# Patient Record
Sex: Female | Born: 1935 | Race: White | Hispanic: No | Marital: Married | Smoking: Never smoker
Health system: Southern US, Community
[De-identification: ages and names within clinical notes are randomized; demographics above are authoritative.]

## PROBLEM LIST (undated history)

## (undated) DIAGNOSIS — E785 Hyperlipidemia, unspecified: Secondary | ICD-10-CM

## (undated) DIAGNOSIS — I10 Essential (primary) hypertension: Secondary | ICD-10-CM

## (undated) DIAGNOSIS — G20A1 Parkinson's disease without dyskinesia, without mention of fluctuations: Secondary | ICD-10-CM

## (undated) DIAGNOSIS — G20 Parkinson's disease: Secondary | ICD-10-CM

## (undated) HISTORY — PX: COLON SURGERY: SHX602

## (undated) HISTORY — PX: EYE SURGERY: SHX253

---

## 2003-11-25 ENCOUNTER — Ambulatory Visit: Payer: Self-pay | Admitting: Family Medicine

## 2004-06-01 ENCOUNTER — Ambulatory Visit: Payer: Self-pay | Admitting: Family Medicine

## 2004-06-08 ENCOUNTER — Ambulatory Visit: Payer: Self-pay | Admitting: Family Medicine

## 2005-06-17 ENCOUNTER — Ambulatory Visit: Payer: Self-pay | Admitting: Family Medicine

## 2006-04-26 ENCOUNTER — Ambulatory Visit: Payer: Self-pay | Admitting: Family Medicine

## 2006-10-06 ENCOUNTER — Ambulatory Visit: Payer: Self-pay | Admitting: Family Medicine

## 2006-10-14 ENCOUNTER — Ambulatory Visit: Payer: Self-pay | Admitting: Unknown Physician Specialty

## 2006-10-20 ENCOUNTER — Ambulatory Visit: Payer: Self-pay | Admitting: Ophthalmology

## 2006-10-20 ENCOUNTER — Other Ambulatory Visit: Payer: Self-pay

## 2006-10-31 ENCOUNTER — Ambulatory Visit: Payer: Self-pay | Admitting: Ophthalmology

## 2006-12-07 ENCOUNTER — Ambulatory Visit: Payer: Self-pay | Admitting: Ophthalmology

## 2006-12-13 ENCOUNTER — Ambulatory Visit: Payer: Self-pay | Admitting: Ophthalmology

## 2008-03-28 ENCOUNTER — Ambulatory Visit: Payer: Self-pay | Admitting: Family Medicine

## 2009-09-23 ENCOUNTER — Ambulatory Visit: Payer: Self-pay

## 2009-10-30 ENCOUNTER — Ambulatory Visit: Payer: Self-pay | Admitting: Unknown Physician Specialty

## 2009-11-07 ENCOUNTER — Ambulatory Visit: Payer: Self-pay | Admitting: Unknown Physician Specialty

## 2011-05-21 ENCOUNTER — Ambulatory Visit: Payer: Self-pay | Admitting: Family Medicine

## 2011-06-27 ENCOUNTER — Emergency Department: Payer: Self-pay | Admitting: Emergency Medicine

## 2011-06-27 LAB — URINALYSIS, COMPLETE
Bilirubin,UR: NEGATIVE
Blood: NEGATIVE
Glucose,UR: NEGATIVE mg/dL (ref 0–75)
Leukocyte Esterase: NEGATIVE
Nitrite: NEGATIVE
Ph: 8 (ref 4.5–8.0)
Protein: NEGATIVE
RBC,UR: <1 /HPF (ref 0–5)
Specific Gravity: 1.006 (ref 1.003–1.030)
Squamous Epithelial: 3

## 2011-06-27 LAB — TROPONIN I: Troponin-I: <0.02 ng/mL

## 2011-06-27 LAB — CBC
HCT: 41 % (ref 35.0–47.0)
MCH: 29.6 pg (ref 26.0–34.0)
MCHC: 32.4 g/dL (ref 32.0–36.0)
MCV: 91 fL (ref 80–100)
RBC: 4.48 10*6/uL (ref 3.80–5.20)
RDW: 14.6 % — ABNORMAL HIGH (ref 11.5–14.5)

## 2011-06-27 LAB — COMPREHENSIVE METABOLIC PANEL
Alkaline Phosphatase: 98 U/L (ref 50–136)
Co2: 31 mmol/L (ref 21–32)
EGFR (African American): >60
EGFR (Non-African Amer.): >60
Glucose: 96 mg/dL (ref 65–99)
Osmolality: 279 (ref 275–301)
Potassium: 4.6 mmol/L (ref 3.5–5.1)
SGPT (ALT): 11 U/L — ABNORMAL LOW
Sodium: 139 mmol/L (ref 136–145)
Total Protein: 7.1 g/dL (ref 6.4–8.2)

## 2011-07-02 ENCOUNTER — Ambulatory Visit: Payer: Self-pay | Admitting: Internal Medicine

## 2011-08-09 ENCOUNTER — Encounter: Payer: Self-pay | Admitting: Internal Medicine

## 2011-08-17 LAB — URINALYSIS, COMPLETE
Bacteria: NONE SEEN
Glucose,UR: NEGATIVE mg/dL (ref 0–75)
Ketone: NEGATIVE
Nitrite: POSITIVE
Ph: 7 (ref 4.5–8.0)
Protein: NEGATIVE
RBC,UR: 1 /HPF (ref 0–5)
Specific Gravity: 1.004 (ref 1.003–1.030)
WBC UR: 55 /HPF (ref 0–5)

## 2011-08-19 LAB — URINE CULTURE

## 2011-09-02 ENCOUNTER — Encounter: Payer: Self-pay | Admitting: Internal Medicine

## 2012-03-29 ENCOUNTER — Other Ambulatory Visit: Payer: Self-pay | Admitting: Rheumatology

## 2012-03-29 LAB — BODY FLUID CELL COUNT WITH DIFFERENTIAL
Eosinophil: 0 %
Lymphocytes: 25 %
Neutrophils: 15 %
Nucleated Cell Count: 350 /mm3
Other Cells BF: 0 %

## 2012-03-29 LAB — SYNOVIAL FLUID, CRYSTAL: Crystals, Joint Fluid: NONE SEEN

## 2012-06-05 ENCOUNTER — Ambulatory Visit: Payer: Self-pay | Admitting: General Practice

## 2012-06-05 DIAGNOSIS — Z01810 Encounter for preprocedural cardiovascular examination: Secondary | ICD-10-CM

## 2012-06-05 LAB — URINALYSIS, COMPLETE
Bilirubin,UR: NEGATIVE
Blood: NEGATIVE
Glucose,UR: NEGATIVE mg/dL (ref 0–75)
Ketone: NEGATIVE
Ph: 6 (ref 4.5–8.0)
Protein: NEGATIVE
RBC,UR: 1 /HPF (ref 0–5)
Specific Gravity: 1.009 (ref 1.003–1.030)
WBC UR: 2 /HPF (ref 0–5)

## 2012-06-05 LAB — PROTIME-INR: INR: 0.9

## 2012-06-05 LAB — BASIC METABOLIC PANEL
Co2: 32 mmol/L (ref 21–32)
EGFR (Non-African Amer.): >60
Glucose: 85 mg/dL (ref 65–99)
Osmolality: 278 (ref 275–301)
Potassium: 4 mmol/L (ref 3.5–5.1)
Sodium: 139 mmol/L (ref 136–145)

## 2012-06-05 LAB — CBC
HCT: 39 % (ref 35.0–47.0)
MCH: 29.8 pg (ref 26.0–34.0)
MCHC: 33 g/dL (ref 32.0–36.0)
MCV: 91 fL (ref 80–100)

## 2012-06-05 LAB — SEDIMENTATION RATE: Erythrocyte Sed Rate: 11 mm/hr (ref 0–30)

## 2012-06-05 LAB — MRSA PCR SCREENING

## 2012-06-05 LAB — APTT: Activated PTT: 33.5 secs (ref 23.6–35.9)

## 2012-06-06 LAB — URINE CULTURE

## 2012-06-19 ENCOUNTER — Inpatient Hospital Stay: Payer: Self-pay | Admitting: General Practice

## 2012-06-20 LAB — BASIC METABOLIC PANEL
BUN: 9 mg/dL (ref 7–18)
Chloride: 102 mmol/L (ref 98–107)
Creatinine: 0.56 mg/dL — ABNORMAL LOW (ref 0.60–1.30)
EGFR (African American): >60
EGFR (Non-African Amer.): >60
Glucose: 94 mg/dL (ref 65–99)
Sodium: 135 mmol/L — ABNORMAL LOW (ref 136–145)

## 2012-06-20 LAB — PLATELET COUNT: Platelet: 151 10*3/uL (ref 150–440)

## 2012-06-21 LAB — BASIC METABOLIC PANEL
BUN: 9 mg/dL (ref 7–18)
Calcium, Total: 9.2 mg/dL (ref 8.5–10.1)
EGFR (African American): >60
Glucose: 87 mg/dL (ref 65–99)
Osmolality: 274 (ref 275–301)
Potassium: 3.7 mmol/L (ref 3.5–5.1)
Sodium: 138 mmol/L (ref 136–145)

## 2012-06-21 LAB — HEMOGLOBIN: HGB: 9.5 g/dL — ABNORMAL LOW (ref 12.0–16.0)

## 2012-06-22 ENCOUNTER — Encounter: Payer: Self-pay | Admitting: Internal Medicine

## 2012-06-25 LAB — URINALYSIS, COMPLETE
Bacteria: NONE SEEN
Bilirubin,UR: NEGATIVE
Glucose,UR: NEGATIVE mg/dL (ref 0–75)
Nitrite: NEGATIVE
Ph: 7 (ref 4.5–8.0)
Protein: NEGATIVE
RBC,UR: 2 /HPF (ref 0–5)
WBC UR: 16 /HPF (ref 0–5)

## 2012-07-02 ENCOUNTER — Encounter: Payer: Self-pay | Admitting: Internal Medicine

## 2013-12-13 ENCOUNTER — Emergency Department: Payer: Self-pay | Admitting: Emergency Medicine

## 2014-05-24 NOTE — Discharge Summary (Signed)
PATIENT NAME:  Rachel Hayes, COUDRIET MR#:  [id number] DATE OF BIRTH:  10-Sep-1931  DATE OF ADMISSION:  06/19/2012 DATE OF DISCHARGE:  06/22/2012  ADMITTING DIAGNOSIS: Degenerative arthrosis of the right knee.   DISCHARGE DIAGNOSIS: Degenerative arthrosis of the right knee.   HISTORY: The patient is a 81 year old female who has been followed at [facility] for progression of right knee pain. She has a chronic history of right knee pain. Recently she had been having some increased discomfort and was seen initially by Dr. Lavenia Atlas who did aspiration as well as a cortisone injection. She states that this did help a lot, but she is still having pain that she rated at 5 to 6. The pain generally was with weight-bearing activities. She did not have a lot of pain when she was sitting or sleeping. She states that she was currently receiving physical therapy for strengthening prior to surgery. She has also had an unloading arthritic brace that was custom-made for her which she states did not help and actually caused some increased discomfort. She has Parkinson's and subsequently is having difficulty getting around. She had gone to using a rolling walker at a very slow pace. She states that the pain had increased to the point that she was having significant limitation with her activities of daily living. X-rays taken in [facility] showed narrowing of the lateral cartilage space with bone-on-bone articulation noted as well as being associated with valgus alignment. Osteophyte as well as subchondral sclerosis was noted. After discussion of the risks and benefits of surgical intervention, the patient expressed her understanding of the risks and benefits and agreed for plans for surgical intervention.   PROCEDURE: Right total knee arthroplasty using computer-assisted navigation.   ANESTHESIA: Femoral nerve block with spinal.   SOFT TISSUE RELEASE: Anterior cruciate ligament, posterior cruciate ligament, deep  medial collateral ligament, as well as patellofemoral ligament.   IMPLANTS UTILIZED: DePuy PFC Sigma size 2.5 posterior stabilized femoral component (cemented), size 2.5 MBT tibial component (cemented), 32 mm 3 pegged oval dome patella (cemented), and a 10 mm stabilized rotating platform polyethylene insert.   HOSPITAL COURSE: The patient tolerated the procedure very well. She had no complications. She was taken to PAC-U where she was stabilized and then transferred to the orthopedic floor. She began receiving anticoagulation therapy of Lovenox 30 mg sub-Q q. 12 hours per anesthesia and pharmacy protocol. She was fitted with TED stockings bilaterally. These were allowed to be removed 1 hour per 8 hour shift. The right one was applied on day 2 following removal of the Hemovac and dressing change. The patient was also fitted with AVI compression foot pumps bilaterally set at 80 mmHg.  Her calves have been nontender. There has been no evidence of any DVTs of the lower extremity. Heels were elevated off the bed using rolled towels.   The patient has denied any chest pain or shortness of breath. Vital signs have been stable. She has been afebrile. Hemodynamically she was stable and no transfusions were given. The patient's pain rating was all over the place. Upon entering the room in the morning, she states her pain was a level 2 and then 10 to 15 minutes later the nurses would go back in and she would state it was a 12.  On a scale of 0 to 10, she would say it was 15 and the next moment she would say it was down to 2 or 3.  Not sure what to make of this.  Not sure if this is related to Parkinson's. She did not appear to be in a lot of discomfort during timeframe. She appeared to be sleeping a lot and did not appear to be in discomfort.   Physical therapy was initiated on day 1 for gait training and transfers. This has been somewhat slow secondary to her age and Parkinson's. She has had no complications, however.  Occupational therapy was also initiated on day 1 for ADL and assistive devices.   The patient's IV, Foley, and Hemovac were DC'd on day 2 along with a dressing change. The wound was free of any drainage or signs of infection. Polar Care was reapplied to the surgical leg maintaining a temperature of 40 to 50 degrees Fahrenheit.   DRUG ALLERGIES: BETIMOL, LUMIGAN, STATIN DRUGS.   DISPOSITION: The patient is discharged to rehab facility in improved stable condition.   DISCHARGE INSTRUCTIONS:  She may weight bear as tolerated. Elevate the heels off the bed. Continue TED stockings. These are to be worn around-the-clock. They may be removed 1 hour per 8 hour shift. Incentive spirometer q. 1 hour while awake. Encourage cough and deep breathing q. 2 hours while awake. She is placed on a regular diet. Continue Polar Care maintaining a temperature of 40 to 50 degrees Fahrenheit.  Change dressing as needed.  She has [facility]. She will continue PT for gait training and transfers, OT for ADL and assistive devices. She is  not to take a shower until the staples are removed. If the patient stays longer than 2 weeks, physical therapy is to send Dr. Ernest PineHooten a biweekly report on her progress with no exceptions.   DISCHARGE MEDICATIONS: 1.  Calcium carbonate 500 mg. 2.  Vitamin D 200 units 1 tablet b.i.d. with meal. 3.  Sinemet 25/100 mg 2 tablets at bedtime. 4.  Sinemet 25/100 mg 1 tablet usual schedule every day at 8:00 a.m., 10:00 a.m., 1200, 1400, 1600, and 1800.  5.  Celebrex 200 mg b.i.d.  6.  Senokot-S 1 tablet b.i.d.  7.  Omega-3 fatty acid 1 gram b.i.d.  8.  Prilosec 20 mg q. a.m.  9.  Ditropan 5 mg b.i.d.  10.  Eldepryl 5 mg b.i.d. with meal. 11.   Lovenox 30 mg sub-Q q. 12 hours for 14 days then discontinue and begin taking one 81 mg enteric-coated aspirin.  1.2  Tylenol ES 500 to 1000 mg q. 4 to 6 hours p.r.n. for temperatures of 100.4. 12.   Mylanta 30 mL q. 6 hours p.r.n.  13.  Dulcolax suppository 10 mg rectally daily p.r.n. for constipation. 14.  Milk of Magnesia 30 mL b.i.d. p.r.n.  15.  Oxycodone 5 to 10 mg q. 4 to 6 hours p.r.n. for pain.  16.  Tramadol 50 to 100 mg q. 4 hours p.r.n. for pain.  17.  Enema soapsuds if no results with milk of magnesia or Dulcolax.   PAST MEDICAL HISTORY: 1.  Cystic breast disease. 2.  Arthritis. 3.  Bilateral cataracts.  4.  Parkinson's disease.  PAST SURGICAL HISTORY:  1.  Colostomy in 2013. 2.  Bilateral cataract extraction.  ____________________________ Van ClinesJon Tyller Bowlby, PA [email or web address] D: 06/22/2012 07:46:47 ET T: 06/22/2012 08:01:14 ET JOB#: [id number]  cc: Van ClinesJon Ramey Schiff, PA, <Dictator> Jonthan Leite PA ELECTRONICALLY SIGNED 07/08/2012 9:16

## 2014-05-24 NOTE — Op Note (Signed)
PATIENT NAME:  Roslynn AmbleMCBANE, Tashe L MR#:  [id number] DATE OF BIRTH:  31-Aug-1931  DATE OF PROCEDURE:  06/19/2012  PREOPERATIVE DIAGNOSIS: Degenerative arthrosis of the right knee.   POSTOPERATIVE DIAGNOSIS: Degenerative arthrosis of the right knee.   PROCEDURE PERFORMED: Right total knee arthroplasty using computer-assisted navigation.   SURGEON: Illene LabradorJames P. Angie FavaHooten, Jr., MD   ASSISTANT: Van ClinesJon Wolfe, PA  ANESTHESIA: Femoral nerve block and spinal.   ESTIMATED BLOOD LOSS: 50 mL.   FLUIDS REPLACED: 900 mL of crystalloid.   TOURNIQUET TIME: 80 minutes.   DRAINS: Two medium drains through reinfusion system.   SOFT TISSUE RELEASES: Anterior cruciate ligament, posterior cruciate ligament, deep medial collateral ligament and patellofemoral ligament.   IMPLANTS UTILIZED: DePuy PFC Sigma size 2.5 posterior stabilized femoral component (cemented), size 2.5 MBT tibial component (cemented), 32 mm 3-peg oval dome patella (cemented), and a 10 mm stabilized rotating platform polyethylene insert.   INDICATIONS FOR SURGERY: The patient is a 81 year old female who has been seen for complaints of progressive right knee pain. X-rays demonstrated severe degenerative changes with valgus deformity. After discussion of the risks and benefits of surgical intervention, the patient expressed understanding of the risks and benefits and agreed with plans for surgical intervention.   PROCEDURE IN DETAIL: The patient was brought into the operating room, and after adequate femoral nerve block and spinal anesthesia was achieved, a tourniquet was placed on the patient's upper right thigh. The patient's right knee and leg were cleaned and prepped with alcohol and DuraPrep and draped in the usual sterile fashion. A "timeout" was performed as per usual protocol. The right lower extremity was exsanguinated using an Esmarch, and the tourniquet was inflated to 300 mmHg.  Anterior and longitudinal incisions were made, followed by a  standard mid vastus approach. A large effusion was evacuated. The deep fibers of the medial collateral ligament were elevated in a subperiosteal fashion off the medial flare of the tibia so as to maintain a continuous soft tissue sleeve. The patella was subluxed laterally, and the patellofemoral ligament was incised. Inspection of the knee demonstrated severe degenerative changes in tricompartmental fashion with evidence of eburnated bone and bony erosion most notably to the lateral compartment. Prominent osteophytes were debrided using a rongeur. The anterior and posterior cruciate ligaments were excised. Two 4.0 mm Schanz pins were inserted into the femur and into the tibia for attachment of the array of trackers used for computer-assisted navigation. Hip center was identified using a circumduction technique. Distal landmarks were mapped using the computer. The distal femur and proximal tibia were mapped using the computer. A distal femoral cutting guide was positioned using computer-assisted navigation so as to achieve a 5-degree distal valgus cut. The cut was performed and verified using the computer. The distal femur was sized, and it was felt that a size 2.5 femoral component was appropriate. A size 2.5 cutting guide was positioned, and the anterior cut was performed and verified using the computer. This was followed by completion of the posterior and chamfer cuts. Femoral cutting guide for a central box was then positioned, and the central box cut was performed.   Attention was then directed to the proximal tibia. Medial and lateral menisci were excised. The extramedullary tibial cutting guide was positioned using computer-assisted navigation so as to achieve 0-degree varus-valgus alignment and 0-degrees posterior slope. Cut was performed and verified using the computer. The proximal tibia was sized, and it was felt that a size 2.5 tibial tray was appropriate. Tibial and femoral trials  were inserted  followed by insertion of a 10 mm polyethylene insert. Excellent medial and lateral soft tissue balancing was appreciated both in full extension and in flexion. Finally, the patella was cut and prepared so as to accommodate a 32 mm 3-peg oval dome patella, and the patellar component was placed and the knee was placed through a range of motion with excellent patellar tracking appreciated.   The femoral trial was removed. A central post hole for the tibial component was reamed, followed by insertion of a keel punch. Tibial trials were then removed. The cut surfaces of bone were irrigated with copious amounts of normal saline with antibiotic solution using pulsatile lavage and then suctioned dry. Polymethyl methacrylate cement was prepared in the usual fashion using a vacuum mixer. Cement was applied to the cut surface of the proximal tibia as well as along the undersurface of a size 2.5 MBT tibial component. The tibial component was positioned and impacted into place. Excess cement was removed using freer elevators. Cement was then applied to the cut surface of the femur as well as along the posterior flanges of a size 2.5 femoral component. The femoral component was positioned and impacted into place. Excess cement was removed using freer elevators. A 10 mm polyethylene trial was inserted, and the knee was brought into full extension with steady axial compression applied. Finally, cement was applied to the backside of a 32 mm 3-peg oval dome patella, and the patellar component was positioned and patellar clamp applied. Excess cement was removed using freer elevators.  After adequate curing of cement, the tourniquet was deflated after a total tourniquet time of 80 minutes. Hemostasis was achieved using electrocautery. The knee was irrigated with copious amounts of normal saline with antibiotic solution using pulsatile lavage and then suctioned dry. The knee was inspected for any residual cement debris; 30 mL of  0.25% Marcaine with epinephrine was injected along the posterior capsule. A 10 mm stabilized rotating platform polyethylene insert was inserted, and the knee was placed through a range of motion. Excellent patellar tracking was appreciated, and excellent medial and lateral soft tissue balancing was noted. Two medium drains were placed in the wound bed and brought out through a separate stab incision to be attached to a reinfusion system. The medial parapatellar portion of the incision was reapproximated using interrupted sutures of #1 Vicryl. The subcutaneous tissue was approximated in layers using first #0 Vicryl followed by #2-0 Vicryl. The skin was closed with skin staples. Sterile dressing was applied.   The patient tolerated the procedure well. She was transported to the recovery room in stable condition.   ____________________________ Illene Labrador. Angie Fava., MD jph:cb D: 06/19/2012 14:28:35 ET T: 06/19/2012 20:37:12 ET JOB#: [id number]  cc: Illene Labrador. Angie Fava., MD, <Dictator> Illene Labrador Angie Fava MD ELECTRONICALLY SIGNED 07/05/2012 19:42

## 2016-05-23 ENCOUNTER — Emergency Department: Payer: Medicare Other

## 2016-05-23 ENCOUNTER — Emergency Department
Admission: EM | Admit: 2016-05-23 | Discharge: 2016-05-23 | Disposition: A | Discharge status: Home / Self Care | Payer: Medicare Other | Attending: Emergency Medicine | Admitting: Emergency Medicine

## 2016-05-23 ENCOUNTER — Encounter: Payer: Self-pay | Admitting: Emergency Medicine

## 2016-05-23 DIAGNOSIS — R071 Chest pain on breathing: Secondary | ICD-10-CM | POA: Diagnosis present

## 2016-05-23 DIAGNOSIS — G20 Parkinson's disease: Secondary | ICD-10-CM | POA: Diagnosis not present

## 2016-05-23 DIAGNOSIS — I10 Essential (primary) hypertension: Secondary | ICD-10-CM | POA: Diagnosis not present

## 2016-05-23 HISTORY — DX: Parkinson's disease: G20

## 2016-05-23 HISTORY — DX: Hyperlipidemia, unspecified: E78.5

## 2016-05-23 HISTORY — DX: Essential (primary) hypertension: I10

## 2016-05-23 HISTORY — DX: Parkinson's disease without dyskinesia, without mention of fluctuations: G20.A1

## 2016-05-23 LAB — CBC
HCT: 42.3 % (ref 35.0–47.0)
Hemoglobin: 13.9 g/dL (ref 12.0–16.0)
MCH: 30 pg (ref 26.0–34.0)
MCHC: 32.9 g/dL (ref 32.0–36.0)
MCV: 91 fL (ref 80.0–100.0)
PLATELETS: 256 10*3/uL (ref 150–440)
RBC: 4.64 MIL/uL (ref 3.80–5.20)
RDW: 14.8 % — AB (ref 11.5–14.5)
WBC: 10.8 10*3/uL (ref 3.6–11.0)

## 2016-05-23 LAB — BASIC METABOLIC PANEL
Anion gap: 7 (ref 5–15)
BUN: 20 mg/dL (ref 6–20)
CALCIUM: 9.9 mg/dL (ref 8.9–10.3)
CO2: 28 mmol/L (ref 22–32)
Chloride: 101 mmol/L (ref 101–111)
Creatinine, Ser: 0.76 mg/dL (ref 0.44–1.00)
GFR calc Af Amer: >60 mL/min (ref >60)
Glucose, Bld: 94 mg/dL (ref 65–99)
POTASSIUM: 4.4 mmol/L (ref 3.5–5.1)
SODIUM: 136 mmol/L (ref 135–145)

## 2016-05-23 LAB — HEPATIC FUNCTION PANEL
ALBUMIN: 3.8 g/dL (ref 3.5–5.0)
ALK PHOS: 73 U/L (ref 38–126)
ALT: <5 U/L — ABNORMAL LOW (ref 14–54)
AST: 18 U/L (ref 15–41)
Bilirubin, Direct: <0.1 mg/dL — ABNORMAL LOW (ref 0.1–0.5)
TOTAL PROTEIN: 6.6 g/dL (ref 6.5–8.1)
Total Bilirubin: 0.7 mg/dL (ref 0.3–1.2)

## 2016-05-23 LAB — TROPONIN I

## 2016-05-23 MED ORDER — IOPAMIDOL (ISOVUE-370) INJECTION 76%
75.0000 mL | Freq: Once | INTRAVENOUS | Status: AC | PRN
  Administered 2016-05-23: 75 mL via INTRAVENOUS

## 2016-05-23 NOTE — ED Triage Notes (Signed)
Pt brought in by [facility] from home for chest pain that started this morning, pain is worse with deep breath in. Pt rates pain 6/10, pt denies radiation of pain or any other symptoms. Pt does not appear to be in any distress at this time.

## 2016-05-23 NOTE — ED Provider Notes (Signed)
Rachel Hayes   ____________________________________________   First MD Initiated Contact with Patient 05/23/16 0915     (approximate)  I have reviewed the triage vital signs and the nursing notes.   HISTORY  Chief Complaint Chest Pain    HPI Rachel Hayes is a 83 y.o. female who reports onset of sharp pleuritic chest pain in her chest this morning possibly 4:00. Pain is sharp and stabbing patient is very vague about the way it feels but it seems to be in the epigastric area radiating up to the throat. Patient denies any fever or vomiting coughing or any other complaints. Patient does not want pain medicine at present.Pain seems to be moderate in nature.  Past Medical History:  Diagnosis Date  . Hyperlipemia   . Hypertension   . Parkinson disease (HCC)     There are no active problems to display for this patient.   Past Surgical History:  Procedure Laterality Date  . COLON SURGERY    . EYE SURGERY      Prior to Admission medications   Not on File    Allergies Patient has no known allergies.  No family history on file.  Social History Social History  Substance Use Topics  . Smoking status: Never Smoker  . Smokeless tobacco: Never Used  . Alcohol use No    Review of Systems Constitutional: No fever/chills Eyes: No visual changes. ENT: No sore throat. Cardiovascular: See history of present illness Respiratory: Denies shortness of breath. Gastrointestinal: No abdominal pain.  No nausea, no vomiting.  No diarrhea.  No constipation. Genitourinary: Negative for dysuria. Musculoskeletal: Negative for back pain. Skin: Negative for rash. Neurological: Negative for headaches, focal weakness or numbness.  10-point ROS otherwise negative.  ____________________________________________   PHYSICAL EXAM:  VITAL SIGNS: ED Triage Vitals  Enc Vitals Group     BP 05/23/16 0926 (!) 159/93     Pulse  Rate 05/23/16 0926 66     Resp 05/23/16 0926 15     Temp 05/23/16 0926 98.2 F (36.8 C)     Temp Source 05/23/16 0926 Oral     SpO2 05/23/16 0926 98 %     Weight 05/23/16 0916 120 lb (54.4 kg)     Height --      Head Circumference --      Peak Flow --      Pain Score 05/23/16 0916 6     Pain Loc --      Pain Edu? --      Excl. in GC? --     Constitutional: Alert and oriented. Well appearing and in no acute distress. Eyes: Conjunctivae are normal. PERRL. EOMI. Head: Atraumatic. Nose: No congestion/rhinnorhea. Mouth/Throat: Mucous membranes are moist.  Oropharynx non-erythematous. Neck: No stridor.   Cardiovascular: Normal rate, regular rhythm. Grossly normal heart sounds.  Good peripheral circulation. Respiratory: Normal respiratory effort.  No retractions. Lungs CTAB. Gastrointestinal: Soft and nontender. No distention. No abdominal bruits. No CVA tenderness. Musculoskeletal: No lower extremity tenderness nor edema.  No joint effusions. Skin:  Skin is warm, dry and intact. No rash noted. No spinal tenderness is present.  ____________________________________________   LABS (all labs ordered are listed, but only abnormal results are displayed)  Labs Reviewed  CBC - Abnormal; Notable for the following:       Result Value   RDW 14.8 (*)    All other components within normal limits  HEPATIC FUNCTION PANEL - Abnormal; Notable for  the following:    ALT <5 (*)    Bilirubin, Direct <0.1 (*)    All other components within normal limits  BASIC METABOLIC PANEL  TROPONIN I   ____________________________________________  EKG  EKG read and interpreted by me shows normal sinus rhythm rate of 69 right axis deviation no other abnormalities. ____________________________________________  RADIOLOGY  Study Result   CLINICAL DATA:  Patient with chest pain. History of Parkinson's disease. Pain with inspiration.  EXAM: CHEST  2 VIEW  COMPARISON:   None.  FINDINGS: Cardiomegaly. Tortuosity and calcification of the thoracic aorta. No consolidative pulmonary opacities. No pleural effusion or pneumothorax. Age-indeterminate wedge compression deformity of a mid and lower thoracic spine vertebral body. Degenerative changes of the thoracic spine.  IMPRESSION: Age-indeterminate wedge compression deformities of a mid and lower thoracic spine vertebral body, recommend correlation for point tenderness.  Cardiomegaly.  Aortic atherosclerosis.   Electronically Signed   By: Annia Belt M.D.   On: 05/23/2016 09:56     Study Result   CLINICAL DATA:  Pt brought in by [facility] from home for chest pain that started this morning, pain is worse with deep breath in. Pt rates pain 6/10, pt denies radiation of pain or any other symptoms  EXAM: CT ANGIOGRAPHY CHEST WITH CONTRAST  TECHNIQUE: Multidetector CT imaging of the chest was performed using the standard protocol during bolus administration of intravenous contrast. Multiplanar CT image reconstructions and MIPs were obtained to evaluate the vascular anatomy.  CONTRAST:  75 mL of Isovue 370 intravenous contrast  COMPARISON:  Current chest radiograph  FINDINGS: Cardiovascular: Contrast opacification of the smaller segmental and subsegmental pulmonary arteries is suboptimal limiting their assessment. The larger vessels are well opacified. There is no evidence of a central pulmonary embolus.  The heart is mildly enlarged. There are moderate coronary artery calcifications. There is mild atherosclerosis along the thoracic aorta and at the origin of the branch vessels. No significant stenosis. No aortic dissection.  Mediastinum/Nodes: Moderate hiatal hernia. No mediastinal or hilar masses. No enlarged lymph nodes. No neck base or axillary masses or adenopathy. Trachea is widely patent.  Lungs/Pleura: No evidence of pneumonia or pulmonary edema. There scattered areas  of mild lung scarring. Small dense nodules are noted in the right upper lobe consistent healed granuloma. There is minor dependent subsegmental atelectasis. No pleural effusion. No pneumothorax.  Upper Abdomen: No acute abnormality.  Musculoskeletal: Mild compression fracture of T8. Moderate chronic appearing compression fracture of T11. No other fractures. No osteoblastic or osteolytic lesions. Arthropathic changes are noted of the right shoulder.  Review of the MIP images confirms the above findings.  IMPRESSION: 1. Exam is somewhat limited for the assessment of smaller segmental or subsegmental pulmonary emboli. Allowing for this limitation, there is no evidence of a pulmonary embolus. 2. Moderate size hiatal hernia. 3. Mild cardiomegaly with moderate coronary artery calcifications. 4. Mild compression fracture of T8 of unclear chronicity. Chronic appearing moderate compression fracture of T11 5. No acute findings in the lungs.   Electronically Signed   By: Amie Portland M.D.   On: 05/23/2016 11:48      ____________________________________________   PROCEDURES  Procedure(s) performed:   Procedures  Critical Care performed:  ____________________________________________   INITIAL IMPRESSION / ASSESSMENT AND PLAN / ED COURSE  Pertinent labs & imaging results that were available during my care of the patient were reviewed by me and considered in my medical decision making (see chart for details).       ____________________________________________  FINAL CLINICAL IMPRESSION(S) / ED DIAGNOSES  Final diagnoses:  Chest pain on breathing      NEW MEDICATIONS STARTED DURING THIS VISIT:  New Prescriptions   No medications on file     Hayes:  This document was prepared using Dragon voice recognition software and may include unintentional dictation errors.    Arnaldo Natal, MD 05/23/16 [phone]

## 2016-05-23 NOTE — Discharge Instructions (Signed)
Please use Tylenol for pain. Please follow-up with your regular doctor. Please return if you have forced pain fever shortness of breath or feels sicker.

## 2016-05-23 NOTE — ED Notes (Signed)
Pt given  of aspirin by EMS

## 2016-05-23 NOTE — ED Notes (Signed)

## 2016-10-07 ENCOUNTER — Other Ambulatory Visit: Payer: Self-pay | Admitting: Neurosurgery

## 2016-10-07 DIAGNOSIS — R1312 Dysphagia, oropharyngeal phase: Secondary | ICD-10-CM

## 2016-11-02 ENCOUNTER — Ambulatory Visit
Admission: RE | Admit: 2016-11-02 | Discharge: 2016-11-02 | Disposition: A | Discharge status: Home / Self Care | Payer: Medicare Other | Source: Ambulatory Visit | Attending: Neurosurgery | Admitting: Neurosurgery

## 2016-11-02 DIAGNOSIS — R1312 Dysphagia, oropharyngeal phase: Secondary | ICD-10-CM | POA: Diagnosis not present

## 2016-11-02 NOTE — Therapy (Addendum)
[facility] DIAGNOSTIC RADIOLOGY [address] [place], [place], [address] Phone: [phone]   Fax:     Modified Barium Swallow  Patient Details  Name: Rachel Hayes MRN: [id number] Date of Birth: 03/26/1931 No Data Recorded  Encounter Date: 11/02/2016      End of Session - 11/02/16 1500    Visit Number 1   Number of Visits 1   Date for SLP Re-Evaluation 11/02/16   SLP Start Time 1300   SLP Stop Time  1400   SLP Time Calculation (min) 60 min   Activity Tolerance Patient tolerated treatment well      Past Medical History:  Diagnosis Date  . Hyperlipemia   . Hypertension   . Parkinson disease [facility])     Past Surgical History:  Procedure Laterality Date  . COLON SURGERY    . EYE SURGERY      There were no vitals filed for this visit.     Subjective: Patient behavior: (alertness, ability to follow instructions, etc.): pt awake, verbally responsive. Noted low volume of speech; reduced breath support for speech and min dysarthria during speech. Pt does have a dx of Parkinson's Disease and takes Sinemet 3x daily WITH her meals. Pt has native dentition. Chief complaint: dysphagia. Pt and family/caregiver present indicated that pt has had some increased coughing during meals recently. All denied any decline in her Pulmonary status - no dx of pneumonia or chest colds. Family does cut her foods for her to make the "softer". Pt is being seen by [facility] SLP from [facility] for therapy.   Objective:  Radiological Procedure: A videoflouroscopic evaluation of oral-preparatory, reflex initiation, and pharyngeal phases of the swallow was performed; as well as a screening of the upper esophageal phase.  I. POSTURE: upright II. VIEW: lateral III. COMPENSATORY STRATEGIES: none IV. BOLUSES ADMINISTERED:   Thin Liquid: 5 trials  Nectar-thick Liquid: 1 trial  Honey-thick Liquid: NT  Puree: 3 trials  Mechanical Soft: 1 trial V. RESULTS OF  EVALUATION: A. ORAL PREPARATORY PHASE: (The lips, tongue, and velum are observed for strength and coordination)       **Overall Severity Rating: MILD-MODERATE. Decreased bolus control w/ premature spillage moreso w/ thin liquids; tremorous lingual activity which can impact lingual control of the bolus for timely A-P transfer and conhesion; more of a munching pattern vs rotary w/ increased textured foods; slight-min oral residue cleared adequately b/t trials w/ independent lingual sweep attention and f/u swallow.  B. SWALLOW INITIATION/REFLEX: (The reflex is normal if "triggered" by the time the bolus reached the base of the tongue)  **Overall Severity Rating: MODERATE. Delayed pharyngeal swallow initiation w/ thin liquids(via Cup) spilling to the Pyriform Sinuses and nectar consistency liquids and purees spilling to the Valleculae before pharyngeal swallow initiation engaged resulting in decreased airway closure/timing. Aspiration occurred before the swallow w/ thin liquids x1 - delayed cough response noted. Pt was also distracted during the bolus intake (d/t spillage of drink on her chin) which appeared to negatively impact pt's attention and response. Pt was able to give more focused attention during the next trials w/ no Aspiration occurring during those. Of note, drinking using a Straw increased spillage of bolus material directly to the Pyriform Sinuses w/out pt's awareness/control of the premature spillage.  C. PHARYNGEAL PHASE: (Pharyngeal function is normal if the bolus shows rapid, smooth, and continuous transit through the pharynx and there is no pharyngeal residue after the swallow)  **Overall Severity Rating: [facility]. Pt exhibited no  significant pharyngeal residue post swallowing indicating adequate pharyngeal pressure and laryngeal excursion during swallowing.  D. LARYNGEAL PENETRATION: (Material entering into the laryngeal inlet/vestibule but not aspirated): x1 - Silent E. ASPIRATION: x1(and x1  using a Straw) - initially Silent w/ delayed cough response - also noted pt's volitional cough was reduced in effort and coordination  F. ESOPHAGEAL PHASE: (Screening of the upper esophagus): appeared wfl.     ASSESSMENT: Pt appears to present w/ Mild-Moderate oropharyngeal phase dysphagia w/ increased risk for aspiration moreso w/ thin liquids. During the oral phase, pt presented w/ decreased bolus control w/ premature spillage moreso w/ thin liquids; tremorous lingual activity which can impact lingual control of the bolus for timely A-P transfer and conhesion; more of a munching pattern vs rotary w/ increased textured foods. Pt had slight-min oral residue post swallowing which she cleared adequately b/t trials w/ independent lingual sweep attention and f/u swallow. During the pharyngeal phase, pt exhibited delayed pharyngeal swallow initiation w/ thin liquids(via Cup) spilling to the Pyriform Sinuses and nectar consistency liquids and purees spilling to the Valleculae before pharyngeal swallow initiation engaged resulting in decreased airway closure/timing. Aspiration occurred before the swallow w/ thin liquids x1 - delayed cough response noted. Pt was also distracted during the bolus intake (d/t spillage of drink on her chin) which appeared to negatively impact pt's attention and control during the task. Pt was able to give more focused attention during the next trials using more focus and aspiration precautions w/ no Aspiration occurring during those. Of note, drinking using a Straw increased spillage of bolus material directly to the Pyriform Sinuses w/out pt's awareness/control of the premature spillage and silent Aspiration occurred. Pt exhibited no significant pharyngeal residue post swallowing indicating adequate pharyngeal pressure and laryngeal excursion during swallowing. Recommend pt follow more of a Dysphagia 3(well-cut foods, moistened for less exertion and energy conservation) diet w/ thin  liquids via CUP only - NO Straws. Recommend aspiration precautions w/ any oral intake; reducing distractions during meals. Recommend monitoring for any Pulmonary decline or increased chest congestion/pneumonia - consider use of nectar consistency liquids at that time w/ pt/family if appropriate per pt's wishes. Information given to pt/family on the Dysphagia Drink Cup for pt.   PLAN/RECOMMENDATIONS:  A. Diet: Dysphagia level 3(mech soft) w/ well-cut meats w/ moistened foods; Thin liquids by CUP - NO STRAW.  Pills in Puree for easier swallowing.   B. Swallowing Precautions: Aspiration precautions; NO Straws. Use of Dysphagia Drink Cup for safer intake/swallowing(monitors bolus size).   C. Recommended consultation to f/u w/ [facility] SLP for further education on aspiration precautions; oral care; use of strategies; use of puree w/ Pill swallowing   D. Therapy recommendations: education   E. Results and recommendations were discussed w/ pt and family/caregiver present; video viewed and discussed thoroughly          Patient will benefit from skilled therapeutic intervention in order to improve the following deficits and impairments:   Dysphagia, oropharyngeal - Plan: DG OP Swallowing Func-Medicare/Speech Path, DG OP Swallowing Func-Medicare/Speech Path      G-Codes - 11/25/16 1501    Functional Assessment Tool Used clinical judgement   Functional Limitations Swallowing   Swallow Current Status (Z1245) At least 20 percent but less than 40 percent impaired, limited or restricted   Swallow Goal Status (Y0998) At least 20 percent but less than 40 percent impaired, limited or restricted   Swallow Discharge Status [phone]) At least 20 percent but less than 40 percent impaired, limited  or restricted          Problem List There are no active problems to display for this patient.    Jerilynn Som, MS, CCC-SLP Mykelti Goldenstein 11/02/2016, 3:02 PM  [facility] DIAGNOSTIC RADIOLOGY [address] [place], [place], [address] Phone: [phone]   Fax:     Name: Rachel Hayes MRN: [id number] Date of Birth: 24-May-1931

## 2018-01-03 ENCOUNTER — Other Ambulatory Visit: Payer: Self-pay

## 2018-01-03 ENCOUNTER — Emergency Department
Admission: EM | Admit: 2018-01-03 | Discharge: 2018-01-03 | Disposition: A | Discharge status: Home / Self Care | Payer: Medicare Other | Attending: Emergency Medicine | Admitting: Emergency Medicine

## 2018-01-03 ENCOUNTER — Emergency Department: Payer: Medicare Other

## 2018-01-03 DIAGNOSIS — G20 Parkinson's disease: Secondary | ICD-10-CM | POA: Diagnosis not present

## 2018-01-03 DIAGNOSIS — I10 Essential (primary) hypertension: Secondary | ICD-10-CM | POA: Insufficient documentation

## 2018-01-03 DIAGNOSIS — Z043 Encounter for examination and observation following other accident: Secondary | ICD-10-CM | POA: Diagnosis not present

## 2018-01-03 DIAGNOSIS — R531 Weakness: Secondary | ICD-10-CM | POA: Diagnosis present

## 2018-01-03 LAB — COMPREHENSIVE METABOLIC PANEL
ALBUMIN: 4.2 g/dL (ref 3.5–5.0)
ALK PHOS: 67 U/L (ref 38–126)
ALT: <5 U/L (ref 0–44)
ANION GAP: 10 (ref 5–15)
AST: 15 U/L (ref 15–41)
BUN: 16 mg/dL (ref 8–23)
CALCIUM: 10.1 mg/dL (ref 8.9–10.3)
CHLORIDE: 95 mmol/L — AB (ref 98–111)
CO2: 30 mmol/L (ref 22–32)
Creatinine, Ser: 0.57 mg/dL (ref 0.44–1.00)
GFR calc Af Amer: >60 mL/min (ref >60)
GFR calc non Af Amer: >60 mL/min (ref >60)
GLUCOSE: 93 mg/dL (ref 70–99)
Potassium: 4.3 mmol/L (ref 3.5–5.1)
Sodium: 135 mmol/L (ref 135–145)
Total Bilirubin: 0.9 mg/dL (ref 0.3–1.2)
Total Protein: 7.3 g/dL (ref 6.5–8.1)

## 2018-01-03 LAB — CBC WITH DIFFERENTIAL/PLATELET
ABS IMMATURE GRANULOCYTES: 0.02 10*3/uL (ref 0.00–0.07)
BASOS ABS: 0.1 10*3/uL (ref 0.0–0.1)
BASOS PCT: 1 %
EOS ABS: 0.1 10*3/uL (ref 0.0–0.5)
EOS PCT: 2 %
HCT: 44.3 % (ref 36.0–46.0)
HEMOGLOBIN: 14.4 g/dL (ref 12.0–15.0)
Immature Granulocytes: 0 %
Lymphocytes Relative: 20 %
Lymphs Abs: 1.4 10*3/uL (ref 0.7–4.0)
MCH: 31.5 pg (ref 26.0–34.0)
MCHC: 32.5 g/dL (ref 30.0–36.0)
MCV: 96.9 fL (ref 80.0–100.0)
Monocytes Absolute: 0.5 10*3/uL (ref 0.1–1.0)
Monocytes Relative: 7 %
NRBC: 0 % (ref 0.0–0.2)
Neutro Abs: 5 10*3/uL (ref 1.7–7.7)
Neutrophils Relative %: 70 %
Platelets: 250 10*3/uL (ref 150–400)
RBC: 4.57 MIL/uL (ref 3.87–5.11)
RDW: 13.5 % (ref 11.5–15.5)
WBC: 7.1 10*3/uL (ref 4.0–10.5)

## 2018-01-03 LAB — URINALYSIS, COMPLETE (UACMP) WITH MICROSCOPIC
BACTERIA UA: NONE SEEN
BILIRUBIN URINE: NEGATIVE
Glucose, UA: NEGATIVE mg/dL
HGB URINE DIPSTICK: NEGATIVE
KETONES UR: 5 mg/dL — AB
NITRITE: NEGATIVE
Protein, ur: 100 mg/dL — AB
Specific Gravity, Urine: 1.01 (ref 1.005–1.030)
pH: 7 (ref 5.0–8.0)

## 2018-01-03 LAB — TROPONIN I: Troponin I: 0.03 ng/mL (ref <0.03)

## 2018-01-03 LAB — GLUCOSE, CAPILLARY: Glucose-Capillary: 74 mg/dL (ref 70–99)

## 2018-01-03 MED ORDER — SODIUM CHLORIDE 0.9 % IV SOLN: 1000.0000 mL | Freq: Once | INTRAVENOUS | Status: DC

## 2018-01-03 NOTE — Care Management (Addendum)
Spoke with two of patient's daughters at bedside.  Patient has been on a functional decline for the past month. As of 2 weeks prior, she could ambulate with assist to the shower with assist but not now.  It is hoped that patient would qualify for short term skilled nursing placement to over come her current level of debility. She has a history of parkinson's. Family in the process of coordinating a plan to have round the clock in home caregiver assist. It is hoped that  if patient is approved to go to skilled nursing that she would return home.  Agreeable to home health and agency preference is [facility].  Referral called to [facility] and accepted.  CM instructed family that patient will be seen in a couple of days.  They are requesting hospital bed to replace the one that "is old". Medicare did  provide some reimbursement for the bed. Agency preference is [facility].  They provided the one that is currently in the home.  Discussed that if patient is not in the home, medicare would not pay for the bed until the patient returns to the home if she is placed. Have 10 hour a day caregivers but the are "just sitters."  They do not provide personal care.  Confirmed address and and phone number. Current with pcp Bethann PunchesMark Miller  Medical Necessity For Hospital Bed  The patient has advancing parkinson. She requires frequent position changes that can not be accommodated in a regular bed.  The head of bed must be elevated at least 30 degrees to facilitate respiration and prevent aspiration.  A wedge can not meet this need.

## 2018-01-03 NOTE — ED Provider Notes (Signed)
Social work has been involved, she will go home with family.  We will order home health, PT, home health aide, nurse and social work and try to order a hospital bed.   Rachel Hayes, Rachel E, MD 01/03/18 [phone]

## 2018-01-03 NOTE — ED Notes (Signed)
Kristen from PT called and stated she would be here shortly

## 2018-01-03 NOTE — Evaluation (Signed)
Physical Therapy Evaluation Patient Details Name: Rachel Hayes MRN: [id number] DOB: 15-Apr-1931 Today's Date: 01/03/2018   History of Present Illness  presented to ER after unwitnessed fall in home environment, bilat LE weakness and pain, possible UTI?  Orthopedic imaging negative for acute injury.  Clinical Impression  upon evaluation, patient alert and oriented to basic information; follows simple commands with increased time for processing and task initiation.  Poor voice quality/phonation; generally flat affect. Noticeably fatigued, closing eyes intermittently during session.  Bilat UE/LE generally weak and deconditioned with decreased ROM available bilat ankles (lacking approx 8-10 degrees DF); poor dissociation of extremities with all movement transitions.  Currently requiring max/total assist for bed mobility; mod assist for unsupported sitting balance; max assist for sit/stand and static standing balance.  L posterior/lateral lean with all upright positions, requiring cuing and assist from therapist for correction. Unsafe/unable to attempt additional mobility at this time. Would benefit from skilled PT to address above deficits and promote optimal return to PLOF; recommend transition to STR upon discharge from acute hospitalization, pending ability to actively participate/progress, with goals of increasing transfer indep and decreasing burden of care in home environment.     Follow Up Recommendations SNF(trial pending participation/progress)    Equipment Recommendations       Recommendations for Other Services       Precautions / Restrictions Precautions Precautions: Fall Restrictions Weight Bearing Restrictions: No      Mobility  Bed Mobility Overal bed mobility: Needs Assistance Bed Mobility: Supine to Sit;Sit to Supine     Supine to sit: Mod assist;Max assist Sit to supine: Max assist   General bed mobility comments: generally rigid with poor dissociation of  extremities during movement transitions  Transfers Overall transfer level: Needs assistance   Transfers: Sit to/from Stand Sit to Stand: Mod assist;Max assist         General transfer comment: heavy posterior trunk lean/weight shift; limited/no righting reactions  Ambulation/Gait             General Gait Details: unsafe/uanble  Stairs            Wheelchair Mobility    Modified Rankin (Stroke Patients Only)       Balance Overall balance assessment: Needs assistance Sitting-balance support: No upper extremity supported;Feet supported Sitting balance-Leahy Scale: Poor Sitting balance - Comments: L lateral lean with fatigue, distraction; requires therapist cuing/assist for correction   Standing balance support: Bilateral upper extremity supported Standing balance-Leahy Scale: Zero                               Pertinent Vitals/Pain Pain Assessment: No/denies pain    Home Living Family/patient expects to be discharged to:: Private residence Living Arrangements: Alone Available Help at Discharge: Family;Available PRN/intermittently;Personal care attendant Type of Home: House Home Access: Ramped entrance     Home Layout: One level   Additional Comments: Has private-duty caregiver 10 hours/day (8a-10p) to assist as needed    Prior Function Level of Independence: Needs assistance         Comments: WC level as primary mobility, completing SPT between seating surfaces with assist from caregiver/family as needed.  Do endorse progressive decline in functional ability in recent weeks (most recently requiring caregiver provide up to 90% transfer assist)     Hand Dominance        Extremity/Trunk Assessment   Upper Extremity Assessment Upper Extremity Assessment: Generalized weakness(grossly 3-/5, elevation to shoulder height  only)    Lower Extremity Assessment Lower Extremity Assessment: Generalized weakness(grossly 3-/5 throughout bilat  LEs, ankles lacking approx 8-10 degrees of act assist DF)       Communication   Communication: (hypophonic with poor articulation, voice quality)  Cognition Arousal/Alertness: Lethargic Behavior During Therapy: Flat affect Overall Cognitive Status: History of cognitive impairments - at baseline                                        General Comments      Exercises Other Exercises Other Exercises: Unsupported sitting edge of bed, worked to improve awareness of midline and initiation of balance correction (L lateral lean).  Attempts self -correction when cued, but does not attempt spontaneously.  Requires hands-on assist at all times with upright positioning.   Assessment/Plan    PT Assessment Patient needs continued PT services  PT Problem List Decreased strength;Decreased range of motion;Decreased activity tolerance;Decreased balance;Decreased mobility;Decreased coordination;Decreased cognition;Decreased knowledge of use of DME;Decreased knowledge of precautions;Decreased safety awareness       PT Treatment Interventions DME instruction;Functional mobility training;Therapeutic activities;Therapeutic exercise;Balance training;Patient/family education    PT Goals (Current goals can be found in the Care Plan section)  Acute Rehab PT Goals Patient Stated Goal: to get other help lined up PT Goal Formulation: With patient/family Time For Goal Achievement: 01/17/18 Potential to Achieve Goals: Fair    Frequency Min 2X/week   Barriers to discharge        Co-evaluation               AM-PAC PT "6 Clicks" Mobility  Outcome Measure Help needed turning from your back to your side while in a flat bed without using bedrails?: A Lot Help needed moving from lying on your back to sitting on the side of a flat bed without using bedrails?: A Lot Help needed moving to and from a bed to a chair (including a wheelchair)?: A Lot Help needed standing up from a chair using  your arms (e.g., wheelchair or bedside chair)?: A Lot Help needed to walk in hospital room?: Total Help needed climbing 3-5 steps with a railing? : Total 6 Click Score: 10    End of Session Equipment Utilized During Treatment: Gait belt Activity Tolerance: Patient tolerated treatment well Patient left: in bed;with call bell/phone within reach;with family/visitor present Nurse Communication: Mobility status PT Visit Diagnosis: Muscle weakness (generalized) (M62.81);Unsteadiness on feet (R26.81);History of falling (Z91.81)    Time: 1500-1520 PT Time Calculation (min) (ACUTE ONLY): 20 min   Charges:   PT Evaluation $PT Eval Moderate Complexity: 1 Mod PT Treatments $Therapeutic Activity: 8-22 mins        Beckham Capistran H. Manson Passey, PT, DPT, NCS 01/03/18, 4:30 PM [phone]

## 2018-01-03 NOTE — ED Triage Notes (Signed)
Pt to ED via EMS from home. Per caregiver pt found under bed this AM. Pt denies remebering how she got under bed. Pt a&o x3. Pt has hx of parkinsons and  Frequent utis. Pt c/o leg aches and weakness.

## 2018-01-03 NOTE — Progress Notes (Signed)
Clinical Education officer, museum (CSW) received SNF placement consult from the ED. CSW met with patient and her 2 daughters [name] [phone] and Juliann Pulse [phone] were at bedside. CSW explained that [facility] requires authorization for SNF which could take several days. CSW made daughters aware that per MD patient does not meet criteria for admission to [facility]. CSW explained that a SNF can start [facility] SNF authorization and follow up with family at home. CSW explained that home health care be arranged at home until they hear from the SNF. Per daughters patient lives in [place] and has a private duty caregiver 10 hours per day. Daughters are agreeable for patient to D/C home with home health from the ED today and prefer [facility]. Daughters also requested a hospital bed. FL2 complete and faxed out. CSW provided bed offers. Daughter chose [facility]. Per [facility] admissions coordinator at [facility] he will start [facility] SNF authorization today and will call patient's daughters at home once he receives an approval or denial for SNF from [facility]. Daughters understand that [facility] will follow up with them at home. Per daughters they can transport patient home today. RN case manager aware of above. Please reconsult if future social work needs arise. CSW signing off.   [employer], LCSW [phone]

## 2018-01-03 NOTE — ED Notes (Signed)
ED Provider at bedside. 

## 2018-01-03 NOTE — Care Management (Addendum)
Left voicemail for daughter Olegario MessierKathy to inform her Medicare will not reimburse for another bed until April 15 2018  Late entry  Return call from family.  Patient is not eligible for medicare coverage for hospital bed until 3/134/2020.  Olegario MessierKathy relays that hospital bed is not broken "We just can not figure out how she got out of the bed. There is a bed rail for the upper part of the bed but "it probably had railing for the lower part but we took it off." Discussed that could move bed against a wall and or obtain a portable rail to place on the bed.  At present has elected not to pursue another hospital bed until it can be covered by medicare

## 2018-01-03 NOTE — NC FL2 (Signed)
  [place] MEDICAID FL2 LEVEL OF CARE SCREENING TOOL     IDENTIFICATION  Patient Name: Rachel Hayes Birthdate: Aug 11, 1931 Sex: female Admission Date (Current Location): 01/03/2018  [place]   Facility and Address:  [facility], [address], [place], [place] [address]      Provider Number: [id number]  Attending Physician Name and Address:  Emily FilbertWilliams, Jonathan E, MD  Relative Name and Phone Number:       Current Level of Care: Hospital Recommended Level of Care: Skilled Nursing Facility Prior Approval Number:    Date Approved/Denied:   PASRR Number: [id number])  Discharge Plan: SNF    Current Diagnoses: There are no active problems to display for this patient.   Orientation RESPIRATION BLADDER Height & Weight     Self, Time, Situation, Place  Normal Continent Weight: 85 lb (38.6 kg) Height:  5' (152.4 cm)  BEHAVIORAL SYMPTOMS/MOOD NEUROLOGICAL BOWEL NUTRITION STATUS      Continent Diet(Diet: Regular )  AMBULATORY STATUS COMMUNICATION OF NEEDS Skin   Extensive Assist Verbally Normal                       Personal Care Assistance Level of Assistance  Bathing, Feeding, Dressing Bathing Assistance: Limited assistance Feeding assistance: Independent Dressing Assistance: Limited assistance     Functional Limitations Info  Sight, Hearing, Speech Sight Info: Adequate Hearing Info: Adequate Speech Info: Adequate    SPECIAL CARE FACTORS FREQUENCY  PT (By licensed PT), OT (By licensed OT)     PT Frequency: (5) OT Frequency: (5)            Contractures      Additional Factors Info  Code Status, Allergies Code Status Info: (Not on File. ) Allergies Info: (No Known Allergies. )           Current Medications (01/03/2018):  This is the current hospital active medication list No current facility-administered medications for this encounter.    Current Outpatient Medications  Medication Sig  Dispense Refill  . aspirin (ASPIRIN ADULT LOW DOSE) 81 MG EC tablet Take 81 mg by mouth daily.    . bimatoprost (LUMIGAN) 0.01 % SOLN Place 1 drop into both eyes daily.    . Calcium-Magnesium-Vitamin D (CALCIUM 500 PO) Take 500 mg by mouth daily.    . Carbidopa-Levodopa ER 48.75-195 MG CPCR Take 4 capsules by mouth 3 (three) times daily.    Marland Kitchen. FLUoxetine (PROZAC) 20 MG tablet Take 20 mg by mouth daily.    Marland Kitchen. lisinopril (PRINIVIL,ZESTRIL) 10 MG tablet Take 10 mg by mouth daily.    . Multiple Vitamin (MULTI-VITAMINS) TABS Take 1 tablet by mouth daily.    . Multiple Vitamins-Minerals (ICAPS) CAPS Take 1 capsule by mouth daily.    . Omega-3 1000 MG CAPS Take 1,000 mg by mouth daily.    Marland Kitchen. omeprazole (PRILOSEC) 20 MG capsule Take 20 mg by mouth daily.  3  . oxybutynin (DITROPAN) 5 MG tablet Take 5 mg by mouth 3 (three) times daily.  4  . polyethylene glycol powder (GLYCOLAX/MIRALAX) powder Take 17 g by mouth daily.       Discharge Medications: Please see discharge summary for a list of discharge medications.  Relevant Imaging Results:  Relevant Lab Results:   Additional Information (SSN: [id number])  Zakari Couchman, Darleen CrockerBailey M, LCSW

## 2018-01-03 NOTE — ED Notes (Signed)
Date and time results received: 01/03/18 10:06 AM  (use smartphrase ".now" to insert current time)  Test: troponin Critical Value: 0.03  Name of Provider Notified: Mayford KnifeWilliams  Orders Received? Or Actions Taken?: aware

## 2018-01-03 NOTE — ED Provider Notes (Signed)
[facility] Emergency Department Provider Note       Time seen: ----------------------------------------- 9:27 AM on 01/03/2018 -----------------------------------------   I have reviewed the triage vital signs and the nursing notes.  HISTORY   Chief Complaint Weakness    HPI Rachel Hayes is a 84 y.o. female with a history of lipidemia, hypertension and Parkinson's disease who presents to the ED for an unwitnessed fall.  Caregiver found the patient under the bed this morning.  Patient denies remembering how she got under the bed.  She arrives alert and oriented, has history of Parkinson's disease with frequent UTIs.  She complains of leg aches and weakness.  Past Medical History:  Diagnosis Date  . Hyperlipemia   . Hypertension   . Parkinson disease (HCC)     There are no active problems to display for this patient.   Past Surgical History:  Procedure Laterality Date  . COLON SURGERY    . EYE SURGERY      Allergies Patient has no known allergies.  Social History Social History   Tobacco Use  . Smoking status: Never Smoker  . Smokeless tobacco: Never Used  Substance Use Topics  . Alcohol use: No  . Drug use: Not on file   Review of Systems Constitutional: Negative for fever. Cardiovascular: Negative for chest pain. Respiratory: Negative for shortness of breath. Gastrointestinal: Negative for abdominal pain, vomiting and diarrhea. Musculoskeletal: Negative for back pain.  Positive for leg aches Skin: Negative for rash. Neurological: Positive for weakness  All systems negative/normal/unremarkable except as stated in the HPI  ____________________________________________   PHYSICAL EXAM:  VITAL SIGNS: ED Triage Vitals  Enc Vitals Group     BP 01/03/18 0920 (!) 230/117     Pulse Rate 01/03/18 0920 70     Resp 01/03/18 0920 19     Temp 01/03/18 0920 (!) 97.4 F (36.3 C)     Temp Source 01/03/18 0920 Oral     SpO2 01/03/18  0920 98 %     Weight 01/03/18 0921 85 lb (38.6 kg)     Height 01/03/18 0921 5' (1.524 m)     Head Circumference --      Peak Flow --      Pain Score 01/03/18 0920 10     Pain Loc --      Pain Edu? --      Excl. in GC? --    Constitutional: Alert and oriented.  Chronically ill-appearing Eyes: Conjunctivae are normal. Normal extraocular movements. ENT   Head: Normocephalic and atraumatic.   Nose: No congestion/rhinnorhea.   Mouth/Throat: Mucous membranes are dry   Neck: No stridor. Cardiovascular: Normal rate, regular rhythm. No murmurs, rubs, or gallops. Respiratory: Normal respiratory effort without tachypnea nor retractions. Breath sounds are clear and equal bilaterally. No wheezes/rales/rhonchi. Gastrointestinal: Soft and nontender. Normal bowel sounds Musculoskeletal: Limited range of motion of the extremities, no lower extremity tenderness nor edema. Neurologic:  Normal speech and language. No gross focal neurologic deficits are appreciated.  Skin:  Skin is warm, dry and intact. No rash noted. Psychiatric: Mood and affect are normal.  ____________________________________________  EKG: Interpreted by me.  Sinus rhythm rate 66 bpm, left axis deviation, possible septal infarct age-indeterminate, normal QT  ____________________________________________  ED COURSE:  As part of my medical decision making, I reviewed the following data within the electronic MEDICAL RECORD NUMBER History obtained from family if available, nursing notes, old chart and ekg, as well as notes from prior ED visits.  Patient presented for weakness with possible fall, we will assess with labs and imaging as indicated at this time.   Procedures ____________________________________________   LABS (pertinent positives/negatives)  Labs Reviewed  COMPREHENSIVE METABOLIC PANEL - Abnormal; Notable for the following components:      Result Value   Chloride 95 (*)    All other components within normal  limits  TROPONIN I - Abnormal; Notable for the following components:   Troponin I 0.03 (*)    All other components within normal limits  URINALYSIS, COMPLETE (UACMP) WITH MICROSCOPIC - Abnormal; Notable for the following components:   Color, Urine YELLOW (*)    APPearance HAZY (*)    Ketones, ur 5 (*)    Protein, ur 100 (*)    Leukocytes, UA SMALL (*)    Non Squamous Epithelial PRESENT (*)    All other components within normal limits  URINE CULTURE  CBC WITH DIFFERENTIAL/PLATELET  TROPONIN I  GLUCOSE, CAPILLARY  CBG MONITORING, ED    RADIOLOGY Images were viewed by me  Chest x-ray, pelvis x-ray IMPRESSION: Diffuse osteopenia. No acute fracture or dislocation. Mild degenerative joint space loss of both hips. IMPRESSION: Mild chronic bronchitic changes. Stable borderline cardiomegaly. No CHF nor pneumonia.  Moderate-sized hiatal hernia. ____________________________________________  DIFFERENTIAL DIAGNOSIS   Fall, dehydration, electrolyte abnormality, UTI, occult infection  FINAL ASSESSMENT AND PLAN  Fall, weakness   Plan: The patient had presented for fall with weakness. Patient's labs reveal any acute process.  Initial troponin was 0.03 but on repeat it was normal of uncertain significance. Patient's imaging was negative for any acute process.  Family wants to discuss situation with social work for possible nursing home placement.   Ulice DashJohnathan E Elijah Michaelis, MD   Note: This note was generated in part or whole with voice recognition software. Voice recognition is usually quite accurate but there are transcription errors that can and very often do occur. I apologize for any typographical errors that were not detected and corrected.     Emily FilbertWilliams, Marquail Bradwell E, MD 01/03/18 1434

## 2018-01-03 NOTE — ED Notes (Signed)
Social work at bedside.  

## 2018-01-04 NOTE — Progress Notes (Signed)
01/04/18: [facility] and are bringing the patient into the facility from home today. Per Raiford Nobleick he has been in touch with patient's 2 daughters to coordinate this.    Baker Hughes IncorporatedBailey Nettye Flegal, LCSW [phone]

## 2018-01-05 LAB — URINE CULTURE
Culture: 80000 — AB
Special Requests: NORMAL

## 2018-10-06 ENCOUNTER — Inpatient Hospital Stay
Admission: EM | Admit: 2018-10-06 | Discharge: 2018-11-02 | DRG: 871 | Disposition: E | Payer: Medicare Other | Attending: Internal Medicine | Admitting: Internal Medicine

## 2018-10-06 ENCOUNTER — Emergency Department: Payer: Medicare Other

## 2018-10-06 ENCOUNTER — Encounter: Payer: Self-pay | Admitting: Emergency Medicine

## 2018-10-06 ENCOUNTER — Other Ambulatory Visit: Payer: Self-pay

## 2018-10-06 DIAGNOSIS — Z515 Encounter for palliative care: Secondary | ICD-10-CM

## 2018-10-06 DIAGNOSIS — A408 Other streptococcal sepsis: Secondary | ICD-10-CM | POA: Diagnosis not present

## 2018-10-06 DIAGNOSIS — Z20828 Contact with and (suspected) exposure to other viral communicable diseases: Secondary | ICD-10-CM | POA: Diagnosis present

## 2018-10-06 DIAGNOSIS — R402312 Coma scale, best motor response, none, at arrival to emergency department: Secondary | ICD-10-CM | POA: Diagnosis present

## 2018-10-06 DIAGNOSIS — R64 Cachexia: Secondary | ICD-10-CM | POA: Diagnosis present

## 2018-10-06 DIAGNOSIS — R0902 Hypoxemia: Secondary | ICD-10-CM | POA: Diagnosis present

## 2018-10-06 DIAGNOSIS — Z66 Do not resuscitate: Secondary | ICD-10-CM | POA: Diagnosis present

## 2018-10-06 DIAGNOSIS — J189 Pneumonia, unspecified organism: Secondary | ICD-10-CM | POA: Diagnosis present

## 2018-10-06 DIAGNOSIS — I10 Essential (primary) hypertension: Secondary | ICD-10-CM | POA: Diagnosis present

## 2018-10-06 DIAGNOSIS — R627 Adult failure to thrive: Secondary | ICD-10-CM | POA: Diagnosis present

## 2018-10-06 DIAGNOSIS — Z79899 Other long term (current) drug therapy: Secondary | ICD-10-CM

## 2018-10-06 DIAGNOSIS — Z87440 Personal history of urinary (tract) infections: Secondary | ICD-10-CM

## 2018-10-06 DIAGNOSIS — G9340 Encephalopathy, unspecified: Secondary | ICD-10-CM

## 2018-10-06 DIAGNOSIS — N390 Urinary tract infection, site not specified: Secondary | ICD-10-CM | POA: Diagnosis present

## 2018-10-06 DIAGNOSIS — G9341 Metabolic encephalopathy: Secondary | ICD-10-CM | POA: Diagnosis present

## 2018-10-06 DIAGNOSIS — Z681 Body mass index (BMI) 19 or less, adult: Secondary | ICD-10-CM

## 2018-10-06 DIAGNOSIS — N179 Acute kidney failure, unspecified: Secondary | ICD-10-CM | POA: Diagnosis present

## 2018-10-06 DIAGNOSIS — G20 Parkinson's disease: Secondary | ICD-10-CM | POA: Diagnosis present

## 2018-10-06 DIAGNOSIS — Z7982 Long term (current) use of aspirin: Secondary | ICD-10-CM

## 2018-10-06 DIAGNOSIS — Z7401 Bed confinement status: Secondary | ICD-10-CM

## 2018-10-06 DIAGNOSIS — Z791 Long term (current) use of non-steroidal anti-inflammatories (NSAID): Secondary | ICD-10-CM

## 2018-10-06 DIAGNOSIS — A419 Sepsis, unspecified organism: Secondary | ICD-10-CM

## 2018-10-06 DIAGNOSIS — R402112 Coma scale, eyes open, never, at arrival to emergency department: Secondary | ICD-10-CM | POA: Diagnosis present

## 2018-10-06 DIAGNOSIS — J188 Other pneumonia, unspecified organism: Secondary | ICD-10-CM | POA: Diagnosis present

## 2018-10-06 DIAGNOSIS — L89152 Pressure ulcer of sacral region, stage 2: Secondary | ICD-10-CM | POA: Diagnosis present

## 2018-10-06 DIAGNOSIS — R6520 Severe sepsis without septic shock: Secondary | ICD-10-CM | POA: Diagnosis present

## 2018-10-06 DIAGNOSIS — L8990 Pressure ulcer of unspecified site, unspecified stage: Secondary | ICD-10-CM | POA: Insufficient documentation

## 2018-10-06 DIAGNOSIS — R1310 Dysphagia, unspecified: Secondary | ICD-10-CM | POA: Diagnosis present

## 2018-10-06 DIAGNOSIS — E785 Hyperlipidemia, unspecified: Secondary | ICD-10-CM | POA: Diagnosis present

## 2018-10-06 DIAGNOSIS — R402212 Coma scale, best verbal response, none, at arrival to emergency department: Secondary | ICD-10-CM | POA: Diagnosis present

## 2018-10-06 DIAGNOSIS — Z7189 Other specified counseling: Secondary | ICD-10-CM

## 2018-10-06 DIAGNOSIS — G20A1 Parkinson's disease without dyskinesia, without mention of fluctuations: Secondary | ICD-10-CM | POA: Diagnosis present

## 2018-10-06 DIAGNOSIS — R4182 Altered mental status, unspecified: Secondary | ICD-10-CM | POA: Diagnosis not present

## 2018-10-06 LAB — CBC WITH DIFFERENTIAL/PLATELET
Abs Immature Granulocytes: 0.06 10*3/uL (ref 0.00–0.07)
Basophils Absolute: 0.1 10*3/uL (ref 0.0–0.1)
Basophils Relative: 3 %
Eosinophils Absolute: 0 10*3/uL (ref 0.0–0.5)
Eosinophils Relative: 0 %
HCT: 41.4 % (ref 36.0–46.0)
Hemoglobin: 13.7 g/dL (ref 12.0–15.0)
Immature Granulocytes: 2 %
Lymphocytes Relative: 8 %
Lymphs Abs: 0.2 10*3/uL — ABNORMAL LOW (ref 0.7–4.0)
MCH: 31.6 pg (ref 26.0–34.0)
MCHC: 33.1 g/dL (ref 30.0–36.0)
MCV: 95.4 fL (ref 80.0–100.0)
Monocytes Absolute: 0.1 10*3/uL (ref 0.1–1.0)
Monocytes Relative: 6 %
Neutro Abs: 2.1 10*3/uL (ref 1.7–7.7)
Neutrophils Relative %: 81 %
Platelets: 226 10*3/uL (ref 150–400)
RBC: 4.34 MIL/uL (ref 3.87–5.11)
RDW: 14.3 % (ref 11.5–15.5)
WBC: 2.5 10*3/uL — ABNORMAL LOW (ref 4.0–10.5)
nRBC: 0.8 % — ABNORMAL HIGH (ref 0.0–0.2)

## 2018-10-06 LAB — COMPREHENSIVE METABOLIC PANEL
ALT: 6 U/L (ref 0–44)
AST: 31 U/L (ref 15–41)
Albumin: 3.7 g/dL (ref 3.5–5.0)
Alkaline Phosphatase: 57 U/L (ref 38–126)
Anion gap: 18 — ABNORMAL HIGH (ref 5–15)
BUN: 79 mg/dL — ABNORMAL HIGH (ref 8–23)
CO2: 25 mmol/L (ref 22–32)
Calcium: 10.5 mg/dL — ABNORMAL HIGH (ref 8.9–10.3)
Chloride: 105 mmol/L (ref 98–111)
Creatinine, Ser: 3.04 mg/dL — ABNORMAL HIGH (ref 0.44–1.00)
GFR calc Af Amer: 16 mL/min — ABNORMAL LOW (ref >60)
GFR calc non Af Amer: 13 mL/min — ABNORMAL LOW (ref >60)
Glucose, Bld: 138 mg/dL — ABNORMAL HIGH (ref 70–99)
Potassium: 3.3 mmol/L — ABNORMAL LOW (ref 3.5–5.1)
Sodium: 148 mmol/L — ABNORMAL HIGH (ref 135–145)
Total Bilirubin: 1 mg/dL (ref 0.3–1.2)
Total Protein: 6.8 g/dL (ref 6.5–8.1)

## 2018-10-06 LAB — PROTIME-INR
INR: 1.1 (ref 0.8–1.2)
Prothrombin Time: 14.4 seconds (ref 11.4–15.2)

## 2018-10-06 LAB — LACTIC ACID, PLASMA: Lactic Acid, Venous: 4 mmol/L (ref 0.5–1.9)

## 2018-10-06 LAB — APTT: aPTT: 30 seconds (ref 24–36)

## 2018-10-06 MED ORDER — SODIUM CHLORIDE 0.9 % IV BOLUS
1000.0000 mL | Freq: Once | INTRAVENOUS | Status: AC
  Administered 2018-10-06: 23:00:00 1000 mL via INTRAVENOUS

## 2018-10-06 MED ORDER — METRONIDAZOLE IN NACL 5-0.79 MG/ML-% IV SOLN
500.0000 mg | Freq: Once | INTRAVENOUS | Status: AC
  Administered 2018-10-06: 22:00:00 500 mg via INTRAVENOUS
  Filled 2018-10-06: qty 100

## 2018-10-06 MED ORDER — SODIUM CHLORIDE 0.9 % IV SOLN
2.0000 g | Freq: Once | INTRAVENOUS | Status: AC
  Administered 2018-10-06: 22:00:00 2 g via INTRAVENOUS
  Filled 2018-10-06: qty 2

## 2018-10-06 MED ORDER — VANCOMYCIN HCL IN DEXTROSE 1-5 GM/200ML-% IV SOLN
1000.0000 mg | Freq: Once | INTRAVENOUS | Status: AC
  Administered 2018-10-06: 22:00:00 1000 mg via INTRAVENOUS
  Filled 2018-10-06: qty 200

## 2018-10-06 NOTE — Progress Notes (Signed)
CODE SEPSIS - PHARMACY COMMUNICATION  **Broad Spectrum Antibiotics should be administered within 1 hour of Sepsis diagnosis**  Time Code Sepsis Called/Page Received: 21:41  Antibiotics Ordered: Vancomycin, Cefepime, Metronidazole  Time of 1st antibiotic administration: 22:16 Cefepime given  Additional action taken by pharmacy: n/a  If necessary, Name of Provider/Nurse Contacted: n/a    Vira Blanco ,PharmD Clinical Pharmacist  10/31/2018  10:19 PM

## 2018-10-06 NOTE — Progress Notes (Signed)
PHARMACY -  BRIEF ANTIBIOTIC NOTE   Pharmacy has received consult(s) for Vancomycin and Cefepime from an ED provider.  The patient's profile has been reviewed for ht/wt/allergies/indication/available labs.    One time order(s) placed for Vancomycin and Cefepime by ED provider  Further antibiotics/pharmacy consults should be ordered by admitting physician if indicated.                       Thank you, Vira Blanco 10/15/2018  9:56 PM

## 2018-10-06 NOTE — ED Triage Notes (Signed)
Pt presents from home via [facility] with c/o unresponsiveness. Pt has hx of end stage parkinsons. Family reported to ems that pt was having trouble breathing. When ems arrives pt was 64% on room air. Pt placed on non-rebreather, pt 85% on non-rebreather for ems. Full code per family. Pt currently 87% on non-rebreather at this time. Pt non-verbal at this time.

## 2018-10-06 NOTE — ED Provider Notes (Addendum)
[facility] Emergency Department Provider Note  ____________________________________________   None    (approximate)  I have reviewed the triage vital signs and the nursing notes.   HISTORY  Chief Complaint Altered Mental Status    HPI Rachel Hayes is a 85 y.o. female with history of advanced Parkinson's here with altered mental status.  History obtained primarily via her daughters.  Per report, the patient has had a approximately 58-month progressive decline due to difficulty swallowing and her progressing Parkinson's.  Over the last week, she has had rapid worsening of her symptoms.  She has had decreased level of consciousness.  She has not been able to take her medications.  She essentially stopped swallowing.  She also had been previously treated for UTI and has been on antibiotics.  She also began coughing for the last 24 hours.  She ultimately became worse just more recently, and EMS was called.  On EMS arrival, patient was satting in the 63s on room air.  She does not have a known history of hypoxia.  No other complaints.  Level 5 caveat invoked as remainder of history, ROS, and physical exam limited due to patient's encephalopathy.         Past Medical History:  Diagnosis Date  . Hyperlipemia   . Hypertension   . Parkinson disease [facility])     Patient Active Problem List   Diagnosis Date Noted  . Severe sepsis ([place]) October 16, 2018  . AKI (acute kidney injury) ([place]) 10-16-2018  . UTI (urinary tract infection) 2018/10/16  . Multifocal pneumonia 16-Oct-2018  . HTN (hypertension) 10/16/18  . HLD (hyperlipidemia) 10/16/2018  . Parkinson disease ([place]) 2018-10-16    Past Surgical History:  Procedure Laterality Date  . COLON SURGERY    . EYE SURGERY      Prior to Admission medications   Medication Sig Start Date End Date Taking? Authorizing Provider  aspirin (ASPIRIN ADULT LOW DOSE) 81 MG EC tablet Take 81 mg by mouth daily.    [provider]  bimatoprost (LUMIGAN) 0.01 % SOLN Place 1 drop into both eyes daily.    [provider]  Calcium-Magnesium-Vitamin D (CALCIUM 500 PO) Take 500 mg by mouth daily.    [provider]  Carbidopa-Levodopa ER 48.75-195 MG CPCR Take 4 capsules by mouth 3 (three) times daily. 07/18/17 07/18/18  [provider]  FLUoxetine (PROZAC) 20 MG tablet Take 20 mg by mouth daily. 07/13/17   [provider]  lisinopril (PRINIVIL,ZESTRIL) 10 MG tablet Take 10 mg by mouth daily.    [provider]  Multiple Vitamin (MULTI-VITAMINS) TABS Take 1 tablet by mouth daily.    [provider]  Multiple Vitamins-Minerals (ICAPS) CAPS Take 1 capsule by mouth daily.    [provider]  Omega-3 1000 MG CAPS Take 1,000 mg by mouth daily. 01/08/10   [provider]  omeprazole (PRILOSEC) 20 MG capsule Take 20 mg by mouth daily. 10/24/17   [provider]  oxybutynin (DITROPAN) 5 MG tablet Take 5 mg by mouth 3 (three) times daily. 10/25/17   [provider]  polyethylene glycol powder (GLYCOLAX/MIRALAX) powder Take 17 g by mouth daily.    [provider]    Allergies Patient has no known allergies.  History reviewed. No pertinent family history.  Social History Social History   Tobacco Use  . Smoking status: Never Smoker  . Smokeless tobacco: Never Used  Substance Use Topics  . Alcohol use: No  .  Drug use: Not on file    Review of Systems  Review of Systems  Unable to perform ROS: Dementia     ____________________________________________  PHYSICAL EXAM:      VITAL SIGNS: ED Triage Vitals  Enc Vitals Group     BP      Pulse      Resp      Temp      Temp src      SpO2      Weight      Height      Head Circumference      Peak Flow      Pain Score      Pain Loc      Pain Edu?      Excl. in GC?      Physical Exam Vitals signs and nursing note reviewed.  Constitutional:      General: She  is not in acute distress.    Appearance: She is well-developed. She is ill-appearing and toxic-appearing.  HENT:     Head: Normocephalic and atraumatic.     Mouth/Throat:     Mouth: Mucous membranes are dry.  Eyes:     Conjunctiva/sclera: Conjunctivae normal.  Neck:     Musculoskeletal: Neck supple.  Cardiovascular:     Rate and Rhythm: Regular rhythm. Tachycardia present.     Heart sounds: Normal heart sounds. No murmur. No friction rub.  Pulmonary:     Effort: Tachypnea and accessory muscle usage present. No respiratory distress.     Breath sounds: Decreased air movement present. Rhonchi and rales present. No wheezing.  Abdominal:     General: There is no distension.     Palpations: Abdomen is soft.     Tenderness: There is no abdominal tenderness.  Skin:    General: Skin is warm.     Capillary Refill: Capillary refill takes less than 2 seconds.  Neurological:     Mental Status: She is lethargic.     GCS: GCS eye subscore is 1. GCS verbal subscore is 1. GCS motor subscore is 1.     Motor: No abnormal muscle tone.       ____________________________________________   LABS (all labs ordered are listed, but only abnormal results are displayed)  Labs Reviewed  LACTIC ACID, PLASMA - Abnormal; Notable for the following components:      Result Value   Lactic Acid, Venous 4.0 (*)    All other components within normal limits  COMPREHENSIVE METABOLIC PANEL - Abnormal; Notable for the following components:   Sodium 148 (*)    Potassium 3.3 (*)    Glucose, Bld 138 (*)    BUN 79 (*)    Creatinine, Ser 3.04 (*)    Calcium 10.5 (*)    GFR calc non Af Amer 13 (*)    GFR calc Af Amer 16 (*)    Anion gap 18 (*)    All other components within normal limits  CBC WITH DIFFERENTIAL/PLATELET - Abnormal; Notable for the following components:   WBC 2.5 (*)    nRBC 0.8 (*)    Lymphs Abs 0.2 (*)    All other components within normal limits  CULTURE, BLOOD (ROUTINE X 2)  CULTURE, BLOOD  (ROUTINE X 2)  URINE CULTURE  SARS CORONAVIRUS 2 (HOSPITAL ORDER, PERFORMED IN [facility])  APTT  PROTIME-INR  LACTIC ACID, PLASMA  URINALYSIS, ROUTINE W REFLEX MICROSCOPIC    ____________________________________________  EKG: Sinus tachycardia, VR 105. QRS 80, QTc  480. Baseline wander. Non-specific ST changes likely rate-related/demand. ________________________________________  RADIOLOGY All imaging, including plain films, CT scans, and ultrasounds, independently reviewed by me, and interpretations confirmed via formal radiology reads.  ED MD interpretation:   CXR: Multifocal PNA  Official radiology report(s): Dg Chest Port 1 View  Result Date: 10/03/2018 CLINICAL DATA:  Shortness of breath EXAM: PORTABLE CHEST 1 VIEW COMPARISON:  01/03/2018 FINDINGS: Patchy perihilar opacity bilaterally. More confluent consolidation at the left base. Possible small left effusion. Normal heart size. No pneumothorax. Vague lucency over the right upper quadrant. IMPRESSION: 1. Patchy perihilar airspace opacity with more confluent consolidation in the left lung base concerning for multifocal pneumonia. Possible small left effusion 2. Vague lucency over the right upper quadrant of the abdomen, recommend decubitus view to exclude free air. Electronically Signed   By: Jasmine PangKim  Fujinaga M.D.   On: 12/21/2018 21:55    ____________________________________________  PROCEDURES   Procedure(s) performed (including Critical Care):  .Critical Care Performed by: Shaune PollackIsaacs, Saraiyah Hemminger, MD Authorized by: Shaune PollackIsaacs, Catrell Morrone, MD   Critical care provider statement:    Critical care time (minutes):  35   Critical care time was exclusive of:  Separately billable procedures and treating other patients and teaching time   Critical care was necessary to treat or prevent imminent or life-threatening deterioration of the following conditions:  Cardiac failure, circulatory failure and sepsis   Critical care was time  spent personally by me on the following activities:  Development of treatment plan with patient or surrogate, discussions with consultants, evaluation of patient's response to treatment, examination of patient, obtaining history from patient or surrogate, ordering and performing treatments and interventions, ordering and review of laboratory studies, ordering and review of radiographic studies, pulse oximetry, re-evaluation of patient's condition and review of old charts   I assumed direction of critical care for this patient from another provider in my specialty: no      ____________________________________________  INITIAL IMPRESSION / MDM / ASSESSMENT AND PLAN / ED COURSE  As part of my medical decision making, I reviewed the following data within the electronic MEDICAL RECORD NUMBER Notes from prior ED visits and [place] Controlled Substance Database      *Rachel Hayes was evaluated in Emergency Department on 10/07/2018 for the symptoms described in the history of present illness. She was evaluated in the context of the global COVID-19 pandemic, which necessitated consideration that the patient might be at risk for infection with the SARS-CoV-2 virus that causes COVID-19. Institutional protocols and algorithms that pertain to the evaluation of patients at risk for COVID-19 are in a state of rapid change based on information released by regulatory bodies including the CDC and federal and state organizations. These policies and algorithms were followed during the patient's care in the ED.  Some ED evaluations and interventions may be delayed as a result of limited staffing during the pandemic.*      Medical Decision Making: 85 year old female here with septic shock secondary to suspected multifocal pneumonia.  Arrives profoundly hypoxic and in obvious respiratory distress.  Lab work shows lactic acidosis, significant acute kidney injury, and severe sepsis with lactate of 4.  Patient given broad-spectrum  fluids and antibiotics.  She has demonstrated significant improvement with this.  Immediately on arrival, I discussed her severe illness with both daughters.  Given her severe underlying Parkinson's, they agree the patient is DNR/DNI and this is in keeping with her previously stated wishes.  However, they would be amenable to medical management, which will  continue for now.  ____________________________________________  FINAL CLINICAL IMPRESSION(S) / ED DIAGNOSES  Final diagnoses:  Sepsis due to pneumonia (HCC)  AKI (acute kidney injury) (HCC)  Encephalopathy     MEDICATIONS GIVEN DURING THIS VISIT:  Medications  ceFEPIme (MAXIPIME) 2 g in sodium chloride 0.9 % 100 mL IVPB (0 g Intravenous Stopped 10/05/2018 2304)  metroNIDAZOLE (FLAGYL) IVPB 500 mg (0 mg Intravenous Stopped 10/24/2018 2321)  vancomycin (VANCOCIN) IVPB 1000 mg/200 mL premix (0 mg Intravenous Stopped 10/19/2018 2321)  sodium chloride 0.9 % bolus 1,000 mL (0 mLs Intravenous Stopped 10/08/2018 2321)  sodium chloride 0.9 % bolus 1,000 mL (1,000 mLs Intravenous New Bag/Given 10/25/2018 2238)     ED Discharge Orders    None       Note:  This document was prepared using Dragon voice recognition software and may include unintentional dictation errors.   Shaune PollackIsaacs, Ashli Selders, MD 10/07/18 Marlyne Beards0002    Shaune PollackIsaacs, Genevive Printup, MD 10/23/18 1141

## 2018-10-07 ENCOUNTER — Other Ambulatory Visit: Payer: Self-pay

## 2018-10-07 DIAGNOSIS — Z681 Body mass index (BMI) 19 or less, adult: Secondary | ICD-10-CM | POA: Diagnosis not present

## 2018-10-07 DIAGNOSIS — R64 Cachexia: Secondary | ICD-10-CM | POA: Diagnosis present

## 2018-10-07 DIAGNOSIS — E785 Hyperlipidemia, unspecified: Secondary | ICD-10-CM | POA: Diagnosis present

## 2018-10-07 DIAGNOSIS — N179 Acute kidney failure, unspecified: Secondary | ICD-10-CM | POA: Diagnosis present

## 2018-10-07 DIAGNOSIS — L89152 Pressure ulcer of sacral region, stage 2: Secondary | ICD-10-CM | POA: Diagnosis present

## 2018-10-07 DIAGNOSIS — R4182 Altered mental status, unspecified: Secondary | ICD-10-CM | POA: Diagnosis present

## 2018-10-07 DIAGNOSIS — R627 Adult failure to thrive: Secondary | ICD-10-CM | POA: Diagnosis present

## 2018-10-07 DIAGNOSIS — R402112 Coma scale, eyes open, never, at arrival to emergency department: Secondary | ICD-10-CM | POA: Diagnosis present

## 2018-10-07 DIAGNOSIS — R402312 Coma scale, best motor response, none, at arrival to emergency department: Secondary | ICD-10-CM | POA: Diagnosis present

## 2018-10-07 DIAGNOSIS — A408 Other streptococcal sepsis: Secondary | ICD-10-CM | POA: Diagnosis present

## 2018-10-07 DIAGNOSIS — R1310 Dysphagia, unspecified: Secondary | ICD-10-CM | POA: Diagnosis present

## 2018-10-07 DIAGNOSIS — Z66 Do not resuscitate: Secondary | ICD-10-CM | POA: Diagnosis present

## 2018-10-07 DIAGNOSIS — R0902 Hypoxemia: Secondary | ICD-10-CM | POA: Diagnosis present

## 2018-10-07 DIAGNOSIS — Z7401 Bed confinement status: Secondary | ICD-10-CM | POA: Diagnosis not present

## 2018-10-07 DIAGNOSIS — Z515 Encounter for palliative care: Secondary | ICD-10-CM | POA: Diagnosis not present

## 2018-10-07 DIAGNOSIS — Z79899 Other long term (current) drug therapy: Secondary | ICD-10-CM | POA: Diagnosis not present

## 2018-10-07 DIAGNOSIS — R6520 Severe sepsis without septic shock: Secondary | ICD-10-CM | POA: Diagnosis present

## 2018-10-07 DIAGNOSIS — Z791 Long term (current) use of non-steroidal anti-inflammatories (NSAID): Secondary | ICD-10-CM | POA: Diagnosis not present

## 2018-10-07 DIAGNOSIS — Z87440 Personal history of urinary (tract) infections: Secondary | ICD-10-CM | POA: Diagnosis not present

## 2018-10-07 DIAGNOSIS — Z20828 Contact with and (suspected) exposure to other viral communicable diseases: Secondary | ICD-10-CM | POA: Diagnosis present

## 2018-10-07 DIAGNOSIS — R402212 Coma scale, best verbal response, none, at arrival to emergency department: Secondary | ICD-10-CM | POA: Diagnosis present

## 2018-10-07 DIAGNOSIS — J189 Pneumonia, unspecified organism: Secondary | ICD-10-CM | POA: Diagnosis present

## 2018-10-07 DIAGNOSIS — A419 Sepsis, unspecified organism: Secondary | ICD-10-CM | POA: Diagnosis not present

## 2018-10-07 DIAGNOSIS — I10 Essential (primary) hypertension: Secondary | ICD-10-CM | POA: Diagnosis present

## 2018-10-07 DIAGNOSIS — G20 Parkinson's disease: Secondary | ICD-10-CM | POA: Diagnosis present

## 2018-10-07 DIAGNOSIS — Z7189 Other specified counseling: Secondary | ICD-10-CM | POA: Diagnosis not present

## 2018-10-07 DIAGNOSIS — G9341 Metabolic encephalopathy: Secondary | ICD-10-CM | POA: Diagnosis present

## 2018-10-07 LAB — BLOOD CULTURE ID PANEL (REFLEXED)

## 2018-10-07 LAB — LACTIC ACID, PLASMA: Lactic Acid, Venous: 4.7 mmol/L (ref 0.5–1.9)

## 2018-10-07 LAB — CBC
HCT: 38.8 % (ref 36.0–46.0)
Hemoglobin: 12.7 g/dL (ref 12.0–15.0)
MCH: 31.3 pg (ref 26.0–34.0)
MCHC: 32.7 g/dL (ref 30.0–36.0)
MCV: 95.6 fL (ref 80.0–100.0)
Platelets: 165 10*3/uL (ref 150–400)
RBC: 4.06 MIL/uL (ref 3.87–5.11)
RDW: 14.4 % (ref 11.5–15.5)
WBC: 4.3 10*3/uL (ref 4.0–10.5)
nRBC: 0 % (ref 0.0–0.2)

## 2018-10-07 LAB — URINALYSIS, ROUTINE W REFLEX MICROSCOPIC
Glucose, UA: NEGATIVE mg/dL
Ketones, ur: 5 mg/dL — AB
Nitrite: NEGATIVE
Protein, ur: 30 mg/dL — AB
Specific Gravity, Urine: 1.021 (ref 1.005–1.030)
Squamous Epithelial / HPF: NONE SEEN (ref 0–5)
pH: 5 (ref 5.0–8.0)

## 2018-10-07 LAB — BASIC METABOLIC PANEL
Anion gap: 15 (ref 5–15)
BUN: 85 mg/dL — ABNORMAL HIGH (ref 8–23)
CO2: 23 mmol/L (ref 22–32)
Calcium: 9.7 mg/dL (ref 8.9–10.3)
Chloride: 111 mmol/L (ref 98–111)
Creatinine, Ser: 2.97 mg/dL — ABNORMAL HIGH (ref 0.44–1.00)
GFR calc Af Amer: 16 mL/min — ABNORMAL LOW (ref >60)
GFR calc non Af Amer: 14 mL/min — ABNORMAL LOW (ref >60)
Glucose, Bld: 94 mg/dL (ref 70–99)
Potassium: 3.4 mmol/L — ABNORMAL LOW (ref 3.5–5.1)
Sodium: 149 mmol/L — ABNORMAL HIGH (ref 135–145)

## 2018-10-07 LAB — VANCOMYCIN, RANDOM: Vancomycin Rm: 15

## 2018-10-07 LAB — SARS CORONAVIRUS 2 BY RT PCR (HOSPITAL ORDER, PERFORMED IN ~~LOC~~ HOSPITAL LAB): SARS Coronavirus 2: NEGATIVE

## 2018-10-07 LAB — MRSA PCR SCREENING: MRSA by PCR: POSITIVE — AB

## 2018-10-07 MED ORDER — ONDANSETRON HCL 4 MG/2ML IJ SOLN: 4.0000 mg | Freq: Four times a day (QID) | INTRAMUSCULAR | Status: DC | PRN

## 2018-10-07 MED ORDER — ACETAMINOPHEN 650 MG RE SUPP: 650.0000 mg | Freq: Four times a day (QID) | RECTAL | Status: DC | PRN

## 2018-10-07 MED ORDER — VANCOMYCIN HCL IN DEXTROSE 1-5 GM/200ML-% IV SOLN
1000.0000 mg | Freq: Once | INTRAVENOUS | Status: AC
  Administered 2018-10-07: 1000 mg via INTRAVENOUS
  Filled 2018-10-07: qty 200

## 2018-10-07 MED ORDER — ONDANSETRON HCL 4 MG PO TABS: 4.0000 mg | ORAL_TABLET | Freq: Four times a day (QID) | ORAL | Status: DC | PRN

## 2018-10-07 MED ORDER — BRINZOLAMIDE 1 % OP SUSP
1.0000 [drp] | Freq: Two times a day (BID) | OPHTHALMIC | Status: DC
  Administered 2018-10-07 – 2018-10-11 (×6): 1 [drp] via OPHTHALMIC
  Filled 2018-10-07: qty 10

## 2018-10-07 MED ORDER — SODIUM CHLORIDE 0.9 % IV SOLN
1.0000 g | INTRAVENOUS | Status: DC
  Administered 2018-10-07: 1 g via INTRAVENOUS
  Filled 2018-10-07 (×2): qty 1

## 2018-10-07 MED ORDER — FLUOXETINE HCL 20 MG PO TABS
20.0000 mg | ORAL_TABLET | Freq: Two times a day (BID) | ORAL | Status: DC
  Filled 2018-10-07 (×3): qty 1

## 2018-10-07 MED ORDER — PANTOPRAZOLE SODIUM 40 MG PO TBEC: 40.0000 mg | DELAYED_RELEASE_TABLET | Freq: Every day | ORAL | Status: DC

## 2018-10-07 MED ORDER — LATANOPROST 0.005 % OP SOLN
1.0000 [drp] | Freq: Every day | OPHTHALMIC | Status: DC
  Administered 2018-10-07 – 2018-10-11 (×3): 1 [drp] via OPHTHALMIC
  Filled 2018-10-07: qty 2.5

## 2018-10-07 MED ORDER — MUPIROCIN 2 % EX OINT
TOPICAL_OINTMENT | Freq: Two times a day (BID) | CUTANEOUS | Status: DC
  Administered 2018-10-07 – 2018-10-08 (×2): via NASAL
  Filled 2018-10-07: qty 22

## 2018-10-07 MED ORDER — SODIUM CHLORIDE 0.45 % IV SOLN
INTRAVENOUS | Status: DC
  Administered 2018-10-07 – 2018-10-08 (×2): via INTRAVENOUS

## 2018-10-07 MED ORDER — HEPARIN SODIUM (PORCINE) 5000 UNIT/ML IJ SOLN
5000.0000 [IU] | Freq: Two times a day (BID) | INTRAMUSCULAR | Status: DC
  Administered 2018-10-07 – 2018-10-08 (×2): 5000 [IU] via SUBCUTANEOUS
  Filled 2018-10-07 (×2): qty 1

## 2018-10-07 MED ORDER — VANCOMYCIN VARIABLE DOSE PER UNSTABLE RENAL FUNCTION (PHARMACIST DOSING): Status: DC

## 2018-10-07 MED ORDER — SODIUM CHLORIDE 0.9 % IV SOLN: INTRAVENOUS | Status: DC

## 2018-10-07 MED ORDER — ENOXAPARIN SODIUM 40 MG/0.4ML ~~LOC~~ SOLN: 40.0000 mg | SUBCUTANEOUS | Status: DC

## 2018-10-07 MED ORDER — ACETAMINOPHEN 325 MG PO TABS: 650.0000 mg | ORAL_TABLET | Freq: Four times a day (QID) | ORAL | Status: DC | PRN

## 2018-10-07 NOTE — Progress Notes (Signed)
Family Meeting Note  Advance Directive:yes  Today a meeting took place with the Patient, daughter at bedside  Patient is unable to participate due [id number] capacity Encephalopathic   The following clinical team members were present during this meeting:MD  The following were discussed:Patient's diagnosis: Sepsis, pneumonia, possible urinary tract infection, history of Parkinson's disease, who is currently on high flow oxygen.  Patient will be admitted to the hospital and will be continued on IV antibiotics and IV fluids and treatment plan of care discussed in detail with the patient's daughter at bedside.  She will is understanding of the plan   patient's progosis: Unable to determine and Goals for treatment: DNR  Philippa Sicks- Daughter  is the healthcare power of attorney  Additional follow-up to be provided: Hospitalist  Time spent during discussion:17 min  Nicholes Mango, MD

## 2018-10-07 NOTE — H&P (Signed)
[facility] [place] NAME: Rachel Hayes    MR#:  [id number]  DATE OF BIRTH:  October 15, 1931  DATE OF ADMISSION:  11-04-18  PRIMARY CARE PHYSICIAN: Rusty Aus, MD   REQUESTING/REFERRING PHYSICIAN: Ellender Hose, MD  CHIEF COMPLAINT:   Chief Complaint  Patient presents with  . Altered Mental Status    HISTORY OF PRESENT ILLNESS:  Rachel Hayes  is a 85 y.o. female who presents with chief complaint as above.  Patient brought to the ED by family for increased alteration of mental status.  She has Parkinson disease at baseline, but has been much more somnolent for the last couple of days.  On arrival at the ED she was found to be somewhat hypoxic as well.  On work-up she is noted to meet severe sepsis criteria, and has multifocal pneumonia on imaging.  She also potentially has a UTI based on her UA findings.  Hospitalist were called for admission  PAST MEDICAL HISTORY:   Past Medical History:  Diagnosis Date  . Hyperlipemia   . Hypertension   . Parkinson disease ([place])      PAST SURGICAL HISTORY:   Past Surgical History:  Procedure Laterality Date  . COLON SURGERY    . EYE SURGERY       SOCIAL HISTORY:   Social History   Tobacco Use  . Smoking status: Never Smoker  . Smokeless tobacco: Never Used  Substance Use Topics  . Alcohol use: No     FAMILY HISTORY:    Family history reviewed and is non-contributory DRUG ALLERGIES:  No Known Allergies  MEDICATIONS AT HOME:   Prior to Admission medications   Medication Sig Start Date End Date Taking? Authorizing Provider  bimatoprost (LUMIGAN) 0.01 % SOLN Place 1 drop into the left eye daily.    Yes [provider]  brinzolamide (AZOPT) 1 % ophthalmic suspension Place 1 drop into the left eye 2 (two) times daily.   Yes [provider]  Carbidopa-Levodopa ER 48.75-195 MG CPCR Take 4 capsules by mouth 2 (two) times daily with breakfast and lunch.   Yes  [provider]  Carbidopa-Levodopa ER 48.75-195 MG CPCR Take 3 capsules by mouth daily with supper.   Yes [provider]  FLUoxetine (PROZAC) 20 MG tablet Take 20 mg by mouth 2 (two) times daily.  07/13/17  Yes [provider]  Multiple Vitamin (MULTI-VITAMINS) TABS Take 1 tablet by mouth daily.   Yes [provider]  Multiple Vitamins-Minerals (ICAPS) CAPS Take 1 capsule by mouth daily.   Yes [provider]  naproxen (NAPROSYN) 375 MG tablet Take 375 mg by mouth daily.   Yes [provider]  Omega-3 1000 MG CAPS Take 1,000 mg by mouth daily. 01/08/10  Yes [provider]  omeprazole (PRILOSEC) 20 MG capsule Take 20 mg by mouth daily. 10/24/17  Yes [provider]  oxybutynin (DITROPAN) 5 MG tablet Take 5 mg by mouth at bedtime.  10/25/17  Yes [provider]  [place] Calcium 500 MG TABS Take 500 mg by mouth daily.   Yes [provider]  polyethylene glycol powder (GLYCOLAX/MIRALAX) powder Take 17 g by mouth daily as needed for mild constipation.    Yes [provider]    REVIEW OF SYSTEMS:  Review of Systems  Unable to perform ROS: Acuity of condition     VITAL SIGNS:   Vitals:   2018/11/04 2330 November 04, 2018 2345 10/07/18 0000 10/07/18 0030  BP: (!) 131/50  (!) 114/55 131/63  Pulse: 81 79 79 79  Resp: (!) 28 (!) 26 (!) 24 (!) 25  SpO2: 94% 96% 96% 97%  Weight:      Height:       Wt Readings from Last 3 Encounters:  10-12-2018 40.8 kg  01/03/18 38.6 kg  05/23/16 54.4 kg    PHYSICAL EXAMINATION:  Physical Exam  Vitals reviewed. Constitutional: She appears well-developed and well-nourished. No distress.  HENT:  Head: Normocephalic and atraumatic.  Mouth/Throat: Oropharynx is clear and moist.  Eyes: Pupils are equal, round, and reactive to light. Conjunctivae and EOM are normal. No scleral icterus.  Neck: Normal range of motion. Neck supple. No JVD present. No thyromegaly present.   Cardiovascular: Normal rate, regular rhythm and intact distal pulses. Exam reveals no gallop and no friction rub.  No murmur heard. Respiratory: Effort normal and breath sounds normal. No respiratory distress. She has no wheezes. She has no rales.  GI: Soft. Bowel sounds are normal. She exhibits no distension. There is no abdominal tenderness.  Musculoskeletal: Normal range of motion.        General: No edema.     Comments: No arthritis, no gout  Lymphadenopathy:    She has no cervical adenopathy.  Neurological: No cranial nerve deficit.  Unable to assess due to patient condition  Skin: Skin is warm and dry. No rash noted. No erythema.  Psychiatric:  Unable to assess due to patient condition    LABORATORY PANEL:   CBC Recent Labs  Lab 12-Oct-2018 2144  WBC 2.5*  HGB 13.7  HCT 41.4  PLT 226   ------------------------------------------------------------------------------------------------------------------  Chemistries  Recent Labs  Lab 12-Oct-2018 2144  NA 148*  K 3.3*  CL 105  CO2 25  GLUCOSE 138*  BUN 79*  CREATININE 3.04*  CALCIUM 10.5*  AST 31  ALT 6  ALKPHOS 57  BILITOT 1.0   ------------------------------------------------------------------------------------------------------------------  Cardiac Enzymes No results for input(s): TROPONINI in the last 168 hours. ------------------------------------------------------------------------------------------------------------------  RADIOLOGY:  Dg Chest Port 1 View  Result Date: 10-12-18 CLINICAL DATA:  Shortness of breath EXAM: PORTABLE CHEST 1 VIEW COMPARISON:  01/03/2018 FINDINGS: Patchy perihilar opacity bilaterally. More confluent consolidation at the left base. Possible small left effusion. Normal heart size. No pneumothorax. Vague lucency over the right upper quadrant. IMPRESSION: 1. Patchy perihilar airspace opacity with more confluent consolidation in the left lung base concerning for multifocal pneumonia.  Possible small left effusion 2. Vague lucency over the right upper quadrant of the abdomen, recommend decubitus view to exclude free air. Electronically Signed   By: Jasmine Pang M.D.   On: 12-Oct-2018 21:55    EKG:   Orders placed or performed during the hospital encounter of October 12, 2018  . EKG 12-Lead  . EKG 12-Lead  . ED EKG 12-Lead  . ED EKG 12-Lead    IMPRESSION AND PLAN:  Principal Problem:   Severe sepsis (HCC) -sepsis due to multifocal pneumonia and likely urinary tract infection.  Her lactic acid was elevated at around 4 on initial draw.  IV fluids initiated, we will continue to trend lactic acid until within normal limits.  IV antibiotics were given.  Blood pressure improved with initial fluids, continue to monitor.  Cultures sent Active Problems:   AKI (acute kidney injury) (HCC) -IV fluids as above, avoid nephrotoxins and monitor for improvement   UTI (urinary tract infection) -IV antibiotics and culture as above   Multifocal pneumonia -IV antibiotics as above, patient is on  nonrebreather and maintaining good oxygen saturation with this supplemental oxygen.  Supportive treatment PRN   HTN (hypertension) -patient's blood pressure was initially low, avoid antihypertensives for now   HLD (hyperlipidemia) -home dose antilipid   Parkinson disease (HCC) -continue home meds   Chart review performed and case discussed with ED provider. Labs, imaging and/or ECG reviewed by provider and discussed with patient/family. Management plans discussed with the patient and/or family.  COVID-19 status: Tested negative     DVT PROPHYLAXIS: SubQ heparin  GI PROPHYLAXIS:  None  ADMISSION STATUS: Inpatient     CODE STATUS: Full  TOTAL TIME TAKING CARE OF THIS PATIENT: 45 minutes.   This patient was evaluated in the context of the global COVID-19 pandemic, which necessitated consideration that the patient might be at risk for infection with the SARS-CoV-2 virus that causes COVID-19.  Institutional protocols and algorithms that pertain to the evaluation of patients at risk for COVID-19 are in a state of rapid change based on information released by regulatory bodies including the CDC and federal and state organizations. These policies and algorithms were followed to the best of this provider's knowledge to date during the patient's care at this facility.  Barney DrainDavid F Ryman Rathgeber 10/07/2018, 1:08 AM  [facility]  Office  [phone]  CC: Primary care physician; Danella PentonMiller, Mark F, MD  Note:  This document was prepared using Dragon voice recognition software and may include unintentional dictation errors.

## 2018-10-07 NOTE — ED Notes (Signed)
This RN attempted to call report and was informed that room has not been assigned and therefore report can not be given per unit charge.

## 2018-10-07 NOTE — ED Notes (Signed)
.. ED TO INPATIENT HANDOFF REPORT  ED Nurse Name and Phone #: Deneise Lever 65  S Name/Age/Gender Rachel Hayes 85 y.o. female Room/Bed: ED02A/ED02A  Code Status   Code Status: Not on file  Home/SNF/Other Home   Triage Complete: Triage complete  Chief Complaint AMS  Triage Note Pt presents from home via acems with c/o unresponsiveness. Pt has hx of end stage parkinsons. Family reported to ems that pt was having trouble breathing. When ems arrives pt was 64% on room air. Pt placed on non-rebreather, pt 85% on non-rebreather for ems. Full code per family. Pt currently 87% on non-rebreather at this time. Pt non-verbal at this time.    Allergies No Known Allergies  Level of Care/Admitting Diagnosis ED Disposition    ED Disposition Condition Comment   Admit  The patient appears reasonably stabilized for admission considering the current resources, flow, and capabilities available in the ED at this time, and I doubt any other [facility] requiring further screening and/or treatment in the ED prior to admission is  present.       B Medical/Surgery History Past Medical History:  Diagnosis Date  . Hyperlipemia   . Hypertension   . Parkinson disease [facility])    Past Surgical History:  Procedure Laterality Date  . COLON SURGERY    . EYE SURGERY       A IV Location/Drains/Wounds Patient Lines/Drains/Airways Status   Active Line/Drains/Airways    Name:   Placement date:   Placement time:   Site:   Days:   Peripheral IV 24-Oct-2018 Right Wrist   24-Oct-2018    2145    Wrist   1   Peripheral IV Oct 24, 2018 Left Arm   10/24/2018    2212    Arm   1          Intake/Output Last 24 hours  Intake/Output Summary (Last 24 hours) at 10/07/2018 0016 Last data filed at 10/07/2018 0014 Gross per 24 hour  Intake 2400 ml  Output -  Net 2400 ml    Labs/Imaging Results for orders placed or performed during the hospital encounter of 2018-10-24 (from the past 48 hour(s))  Lactic acid, plasma     Status:  Abnormal   Collection Time: 2018/10/24  9:44 PM  Result Value Ref Range   Lactic Acid, Venous 4.0 (HH) 0.5 - 1.9 mmol/L    Comment: CRITICAL RESULT CALLED TO, READ BACK BY AND VERIFIED WITH Daleigh Pollinger AT 2227 ON 10/24/18 RWW Performed at [facility], [address] [place], [place] [address]   Comprehensive metabolic panel     Status: Abnormal   Collection Time: October 24, 2018  9:44 PM  Result Value Ref Range   Sodium 148 (H) 135 - 145 mmol/L   Potassium 3.3 (L) 3.5 - 5.1 mmol/L   Chloride 105 98 - 111 mmol/L   CO2 25 22 - 32 mmol/L   Glucose, Bld 138 (H) 70 - 99 mg/dL   BUN 79 (H) 8 - 23 mg/dL   Creatinine, Ser 3.04 (H) 0.44 - 1.00 mg/dL   Calcium 10.5 (H) 8.9 - 10.3 mg/dL   Total Protein 6.8 6.5 - 8.1 g/dL   Albumin 3.7 3.5 - 5.0 g/dL   AST 31 15 - 41 U/L   ALT 6 0 - 44 U/L   Alkaline Phosphatase 57 38 - 126 U/L   Total Bilirubin 1.0 0.3 - 1.2 mg/dL   GFR calc non Af Amer 13 (L) >60 mL/min   GFR calc Af Wyvonnia Lora  16 (L) >60 mL/min   Anion gap 18 (H) 5 - 15    Comment: Performed at [facility], [address] [place], [place] [address]  CBC WITH DIFFERENTIAL     Status: Abnormal   Collection Time: 10/03/2018  9:44 PM  Result Value Ref Range   WBC 2.5 (L) 4.0 - 10.5 K/uL   RBC 4.34 3.87 - 5.11 MIL/uL   Hemoglobin 13.7 12.0 - 15.0 g/dL   HCT 47.841.4 29.536.0 - 62.146.0 %   MCV 95.4 80.0 - 100.0 fL   MCH 31.6 26.0 - 34.0 pg   MCHC 33.1 30.0 - 36.0 g/dL   RDW 30.814.3 65.711.5 - 84.615.5 %   Platelets 226 150 - 400 K/uL   nRBC 0.8 (H) 0.0 - 0.2 %   Neutrophils Relative % 81 %   Neutro Abs 2.1 1.7 - 7.7 K/uL   Lymphocytes Relative 8 %   Lymphs Abs 0.2 (L) 0.7 - 4.0 K/uL   Monocytes Relative 6 %   Monocytes Absolute 0.1 0.1 - 1.0 K/uL   Eosinophils Relative 0 %   Eosinophils Absolute 0.0 0.0 - 0.5 K/uL   Basophils Relative 3 %   Basophils Absolute 0.1 0.0 - 0.1 K/uL   Immature Granulocytes 2 %   Abs Immature Granulocytes 0.06 0.00 - 0.07 K/uL    Comment: Performed at [facility], [address] [place], [place] [address]  APTT     Status: None   Collection Time: 10/23/2018  9:44 PM  Result Value Ref Range   aPTT 30 24 - 36 seconds    Comment: Performed at [facility], [address] [place], [place] [address]  Protime-INR     Status: None   Collection Time: 10/27/2018  9:44 PM  Result Value Ref Range   Prothrombin Time 14.4 11.4 - 15.2 seconds   INR 1.1 0.8 - 1.2    Comment: (NOTE) INR goal varies based on device and disease states. Performed at [facility], [address] [place], [place] [address]   Urinalysis, Routine w reflex microscopic     Status: Abnormal   Collection Time: 10/18/2018  9:44 PM  Result Value Ref Range   Color, Urine AMBER (A) YELLOW    Comment: BIOCHEMICALS MAY BE AFFECTED BY COLOR   APPearance CLOUDY (A) CLEAR   Specific Gravity, Urine 1.021 1.005 - 1.030   pH 5.0 5.0 - 8.0   Glucose, UA NEGATIVE NEGATIVE mg/dL   Hgb urine dipstick SMALL (A) NEGATIVE   Bilirubin Urine SMALL (A) NEGATIVE   Ketones, ur 5 (A) NEGATIVE mg/dL   Protein, ur 30 (A) NEGATIVE mg/dL   Nitrite NEGATIVE NEGATIVE   Leukocytes,Ua MODERATE (A) NEGATIVE   RBC / HPF 6-10 0 - 5 RBC/hpf   WBC, UA 0-5 0 - 5 WBC/hpf   Bacteria, UA MANY (A) NONE SEEN   Squamous Epithelial / LPF NONE SEEN 0 - 5    Comment: Performed at [facility], [address] [place], [place] [address]  SARS Coronavirus 2 [facility](Hospital order, Performed in [facility] hospital lab) Nasopharyngeal Nasopharyngeal Swab     Status: None   Collection Time: 10/23/2018  9:44 PM   Specimen: Nasopharyngeal Swab  Result Value Ref Range   SARS Coronavirus 2 NEGATIVE NEGATIVE    Comment: (NOTE) If result is NEGATIVE SARS-CoV-2 target nucleic acids are NOT DETECTED. The SARS-CoV-2 RNA is generally detectable in upper and lower  respiratory specimens during the acute phase of  infection. The lowest  concentration of SARS-CoV-2 viral copies this assay can detect is 250   copies / mL. A negative result does not preclude SARS-CoV-2 infection  and should not be used as the sole basis for treatment or other  patient management decisions.  A negative result may occur with  improper specimen collection / handling, submission of specimen other  than nasopharyngeal swab, presence of viral mutation(s) within the  areas targeted by this assay, and inadequate number of viral copies  (<250 copies / mL). A negative result must be combined with clinical  observations, patient history, and epidemiological information. If result is POSITIVE SARS-CoV-2 target nucleic acids are DETECTED. The SARS-CoV-2 RNA is generally detectable in upper and lower  respiratory specimens dur ing the acute phase of infection.  Positive  results are indicative of active infection with SARS-CoV-2.  Clinical  correlation with patient history and other diagnostic information is  necessary to determine patient infection status.  Positive results do  not rule out bacterial infection or co-infection with other viruses. If result is PRESUMPTIVE POSTIVE SARS-CoV-2 nucleic acids MAY BE PRESENT.   A presumptive positive result was obtained on the submitted specimen  and confirmed on repeat testing.  While 2019 novel coronavirus  (SARS-CoV-2) nucleic acids may be present in the submitted sample  additional confirmatory testing may be necessary for epidemiological  and / or clinical management purposes  to differentiate between  SARS-CoV-2 and other Sarbecovirus currently known to infect humans.  If clinically indicated additional testing with an alternate test  methodology [phone]) is advised. The SARS-CoV-2 RNA is generally  detectable in upper and lower respiratory sp ecimens during the acute  phase of infection. The expected result is Negative. Fact Sheet for Patients:  BoilerBrush.com.cy Fact Sheet for Healthcare  Providers: https://pope.com/ This test is not yet approved or cleared by the [place] FDA and has been authorized for detection and/or diagnosis of SARS-CoV-2 by FDA under an Emergency Use Authorization (EUA).  This EUA will remain in effect (meaning this test can be used) for the duration of the COVID-19 declaration under Section 564(b)(1) of the Act, 21 U.S.C. section 360bbb-3(b)(1), unless the authorization is terminated or revoked sooner. Performed at [facility], [address] [place], [place] [address]    Dg Chest Port 1 View  Result Date: 10/08/2018 CLINICAL DATA:  Shortness of breath EXAM: PORTABLE CHEST 1 VIEW COMPARISON:  01/03/2018 FINDINGS: Patchy perihilar opacity bilaterally. More confluent consolidation at the left base. Possible small left effusion. Normal heart size. No pneumothorax. Vague lucency over the right upper quadrant. IMPRESSION: 1. Patchy perihilar airspace opacity with more confluent consolidation in the left lung base concerning for multifocal pneumonia. Possible small left effusion 2. Vague lucency over the right upper quadrant of the abdomen, recommend decubitus view to exclude free air. Electronically Signed   By: Jasmine Pang M.D.   On: 10/30/2018 21:55    Pending Labs Unresulted Labs (From admission, onward)    Start     Ordered   10/03/2018 2142  Lactic acid, plasma  Now then every 2 hours,   STAT     10/28/2018 2141   10/31/2018 2142  Blood Culture (routine x 2)  BLOOD CULTURE X 2,   STAT     10/25/2018 2141   10/25/2018 2142  Urine culture  ONCE - STAT,   STAT     10/09/2018 2141          Vitals/Pain Today's Vitals   10/16/2018  2300 01/19/19 2330 01/19/19 2345 10/07/18 0000  BP: (!) 128/50 (!) 131/50  (!) 114/55  Pulse: 88 81 79 79  Resp: (!) 27 (!) 28 (!) 26 (!) 24  SpO2: 100% 94% 96% 96%  Weight:      Height:        Isolation Precautions No active isolations  Medications Medications  ceFEPIme  (MAXIPIME) 2 g in sodium chloride 0.9 % 100 mL IVPB (0 g Intravenous Stopped 10/21/2018 2304)  metroNIDAZOLE (FLAGYL) IVPB 500 mg (0 mg Intravenous Stopped 10/10/2018 2321)  vancomycin (VANCOCIN) IVPB 1000 mg/200 mL premix (0 mg Intravenous Stopped 10/30/2018 2321)  sodium chloride 0.9 % bolus 1,000 mL (0 mLs Intravenous Stopped 10/04/2018 2321)  sodium chloride 0.9 % bolus 1,000 mL (0 mLs Intravenous Stopped 10/07/18 0014)    Mobility non-ambulatory High fall risk   Focused Assessments Neuro Assessment Handoff:  Swallow screen pass? No  Cardiac Rhythm: Normal sinus rhythm       Neuro Assessment: Exceptions to WDL Neuro Checks:      Last Documented NIHSS Modified Score:   Has TPA been given? No If patient is a Neuro Trauma and patient is going to OR before floor call report to 4N Charge nurse: [phone] or [phone]     R Recommendations: See Admitting Provider Note  Report given to:   Additional Notes:

## 2018-10-07 NOTE — ED Notes (Signed)
This RN attempted to call report.  

## 2018-10-07 NOTE — Progress Notes (Signed)
PHARMACY - PHYSICIAN COMMUNICATION CRITICAL VALUE ALERT - BLOOD CULTURE IDENTIFICATION (BCID)  Results for orders placed or performed during the hospital encounter of 11-02-18  Blood Culture ID Panel (Reflexed) (Collected: 2018/11/02  9:44 PM)  Result Value Ref Range   Enterococcus species NOT DETECTED NOT DETECTED   Listeria monocytogenes NOT DETECTED NOT DETECTED   Staphylococcus species NOT DETECTED NOT DETECTED   Staphylococcus aureus (BCID) NOT DETECTED NOT DETECTED   Streptococcus species DETECTED (A) NOT DETECTED   Streptococcus agalactiae NOT DETECTED NOT DETECTED   Streptococcus pneumoniae NOT DETECTED NOT DETECTED   Streptococcus pyogenes NOT DETECTED NOT DETECTED   Acinetobacter baumannii NOT DETECTED NOT DETECTED   Enterobacteriaceae species NOT DETECTED NOT DETECTED   Enterobacter cloacae complex NOT DETECTED NOT DETECTED   Escherichia coli NOT DETECTED NOT DETECTED   Klebsiella oxytoca NOT DETECTED NOT DETECTED   Klebsiella pneumoniae NOT DETECTED NOT DETECTED   Proteus species NOT DETECTED NOT DETECTED   Serratia marcescens NOT DETECTED NOT DETECTED   Haemophilus influenzae NOT DETECTED NOT DETECTED   Neisseria meningitidis NOT DETECTED NOT DETECTED   Pseudomonas aeruginosa NOT DETECTED NOT DETECTED   Candida albicans NOT DETECTED NOT DETECTED   Candida glabrata NOT DETECTED NOT DETECTED   Candida krusei NOT DETECTED NOT DETECTED   Candida parapsilosis NOT DETECTED NOT DETECTED   Candida tropicalis NOT DETECTED NOT DETECTED    Name of physician (or Provider) Contacted: Rufina Falco , NP   Changes to prescribed antibiotics required:   no  Yuepheng Schaller D 10/07/2018  7:30 PM

## 2018-10-07 NOTE — ED Notes (Addendum)
Pt placed on nasal cannula only at 6L.

## 2018-10-07 NOTE — Consult Note (Signed)
Pharmacy Antibiotic Note  Rachel Hayes is a 85 y.o. female admitted on 10/18/2018 with pneumonia.  Pharmacy has been consulted for cefepime and vancomycin dosing. Baseline Scr 0.5-0.7. Scr on admission 3.04.   Plan: Cefepime 1 g q24H   Pt received vancomycin 1000 mg x 1. Will dose by levels. 24 hour vancomycin random level ordered. MRSA PCR ordered. Recommend d/c vancomycin if vancomycin is negative.   Height: 5\' 5"  (165.1 cm) Weight: 90 lb (40.8 kg) IBW/kg (Calculated) : 57  No data recorded.  Recent Labs  Lab 10/05/2018 2144 10/07/18 0034  WBC 2.5*  --   CREATININE 3.04*  --   LATICACIDVEN 4.0* 4.7*    Estimated Creatinine Clearance: 8.7 mL/min (A) (by C-G formula based on SCr of 3.04 mg/dL (H)).    No Known Allergies  Antimicrobials this admission: 9/5 cefepime >>  9/5 vancomycin >>   Dose adjustments this admission: None  Microbiology results: 9/4 BCx: pending 9/4 UCx:  pending 9/5 MRSA PCR: ordered.   Thank you for allowing pharmacy to be a part of this patient's care.  Oswald Hillock, PharmD, BCPS 10/07/2018 8:27 AM

## 2018-10-07 NOTE — Progress Notes (Signed)
Pharmacy Antibiotic Note  Rachel Hayes is a 85 y.o. female admitted on 10-08-18 with sepsis.  Pharmacy has been consulted for Vancomycin dosing.  Plan: Rachel Hayes is a 85 y.o. female admitted on October 08, 2018 with pneumonia.  Pharmacy has been consulted for cefepime and vancomycin dosing. Baseline Scr 0.5-0.7. Scr on admission 3.04.   Cefepime 1 g q24H   Pt received vancomycin 1000 mg x 1. Will dose by levels. 24 hour vancomycin random level ordered. MRSA PCR ordered. Recommend d/c vancomycin if vancomycin is negative.   9/5:  Vanc random @ 2045 = 15 mcg/mL Will order Vancomycin 1 gm IV X 1 and recheck vanc level on 9/6 @ 2200.    Height: 5\' 1"  (154.9 cm) Weight: 76 lb 0.9 oz (34.5 kg) IBW/kg (Calculated) : 47.8  Temp (24hrs), Avg:97.6 F (36.4 C), Min:97.5 F (36.4 C), Max:97.7 F (36.5 C)  Recent Labs  Lab 08-Oct-2018 2144 10/07/18 0034 10/07/18 1241 10/07/18 2045  WBC 2.5*  --  4.3  --   CREATININE 3.04*  --  2.97*  --   LATICACIDVEN 4.0* 4.7*  --   --   VANCORANDOM  --   --   --  15    Estimated Creatinine Clearance: 7.5 mL/min (A) (by C-G formula based on SCr of 2.97 mg/dL (H)).    No Known Allergies  Antimicrobials this admission:   >>    >>   Dose adjustments this admission:  Microbiology results:  BCx:   UCx:    Sputum:    MRSA PCR:   Thank you for allowing pharmacy to be a part of this patient's care.  Lindalee Huizinga D 10/07/2018 9:45 PM

## 2018-10-07 NOTE — Progress Notes (Signed)
[facility] [place] at [facility]   PATIENT NAME: Rachel Hayes    MR#:  [id number]  DATE OF BIRTH:  11-Dec-1931  SUBJECTIVE:  CHIEF COMPLAINT:  Pt was seen and examined in ED , daughter at bedside Patient is very lethargic  REVIEW OF SYSTEMS:  Patient is encephalopathic  DRUG ALLERGIES:  No Known Allergies  VITALS:  Blood pressure 129/74, pulse 90, resp. rate (!) 23, height 5\' 5"  (1.651 m), weight 40.8 kg, SpO2 94 %.  PHYSICAL EXAMINATION:  GENERAL:  85 y.o.-year-old patient lying in the bed with no acute distress.  EYES: Pupils equal, round, reactive to light and accommodation. No scleral icterus. Extraocular muscles intact.  HEENT: Head atraumatic, normocephalic. Oropharynx and nasopharynx clear.  NECK:  Supple, no jugular venous distention. No thyroid enlargement, no tenderness.  LUNGS: Diminished breath sounds bilaterally, no wheezing, rales,rhonchi.  Positive crepitation. No use of accessory muscles of respiration.  CARDIOVASCULAR: S1, S2 normal. No murmurs, rubs, or gallops.  ABDOMEN: Soft, nontender, nondistended. Bowel sounds present.  EXTREMITIES: No pedal edema, cyanosis, or clubbing.  NEUROLOGIC:  encephalopathic t. Gait not checked.  PSYCHIATRIC: The patient is encephalopathic SKIN: No obvious rash, lesion, or ulcer.    LABORATORY PANEL:   CBC Recent Labs  Lab 10/18/2018 2144  WBC 2.5*  HGB 13.7  HCT 41.4  PLT 226   ------------------------------------------------------------------------------------------------------------------  Chemistries  Recent Labs  Lab 10/30/2018 2144  NA 148*  K 3.3*  CL 105  CO2 25  GLUCOSE 138*  BUN 79*  CREATININE 3.04*  CALCIUM 10.5*  AST 31  ALT 6  ALKPHOS 57  BILITOT 1.0   ------------------------------------------------------------------------------------------------------------------  Cardiac Enzymes No results for input(s): TROPONINI in the last 168  hours. ------------------------------------------------------------------------------------------------------------------  RADIOLOGY:  Dg Chest Port 1 View  Result Date: 11/01/2018 CLINICAL DATA:  Shortness of breath EXAM: PORTABLE CHEST 1 VIEW COMPARISON:  01/03/2018 FINDINGS: Patchy perihilar opacity bilaterally. More confluent consolidation at the left base. Possible small left effusion. Normal heart size. No pneumothorax. Vague lucency over the right upper quadrant. IMPRESSION: 1. Patchy perihilar airspace opacity with more confluent consolidation in the left lung base concerning for multifocal pneumonia. Possible small left effusion 2. Vague lucency over the right upper quadrant of the abdomen, recommend decubitus view to exclude free air. Electronically Signed   By: Jasmine PangKim  Fujinaga M.D.   On: 10/26/2018 21:55    EKG:   Orders placed or performed during the hospital encounter of 11/01/2018  . EKG 12-Lead  . EKG 12-Lead  . ED EKG 12-Lead  . ED EKG 12-Lead    ASSESSMENT AND PLAN:     Severe sepsis (HCC) -sepsis due to multifocal pneumonia and likely urinary tract infection.   Her lactic acid was elevated at around 4 on initial draw.  Repeat lactic acid is at 4.7 Hydrate with IV fluids and monitor.   Follow-up on the culture results  IV antibiotics cefepime and vancomycin     AKI (acute kidney injury) (HCC) -IV fluids as above, avoid nephrotoxins and monitor for improvement.  Patient's baseline creatinine was normal in May 2019 currently at 3.0.  Repeat a.m. labs    UTI (urinary tract infection) -IV antibiotics and follow-up Monday culture as above    Multifocal pneumonia -IV antibiotics as above, patient is on nonrebreather and maintaining good oxygen saturation with this supplemental oxygen.  Supportive treatment PRN Currently on oxygen to 6 L by nasal cannula will try to wean down as tolerated    HTN (  hypertension) -patient's blood pressure was initially low, avoid antihypertensives  for now    Parkinson disease ([place]) -continue home meds when patient is more awake and alert    All the records are reviewed and case discussed with Care Management/Social Workerr. Management plans discussed with the patient, daughter at bedside and they are in agreement.  CODE STATUS: DNR  TOTAL TIME TAKING CARE OF THIS PATIENT: 35 minutes.   POSSIBLE D/C IN 2-3 DAYS, DEPENDING ON CLINICAL CONDITION.  Note: This dictation was prepared with Dragon dictation along with smaller phrase technology. Any transcriptional errors that result from this process are unintentional.   Nicholes Mango M.D on 10/07/2018 at 12:06 PM  Between 7am to 6pm - Pager - [phone] After 6pm go to [email or web address] - password [email or web address] [facility]  Office  [phone]  CC: Primary care physician; Rusty Aus, MD

## 2018-10-08 DIAGNOSIS — L8990 Pressure ulcer of unspecified site, unspecified stage: Secondary | ICD-10-CM | POA: Insufficient documentation

## 2018-10-08 LAB — URINE CULTURE: Culture: NO GROWTH

## 2018-10-08 MED ORDER — MORPHINE SULFATE (CONCENTRATE) 10 MG/0.5ML PO SOLN
5.0000 mg | ORAL | Status: DC | PRN
  Administered 2018-10-09 – 2018-10-11 (×4): 5 mg via ORAL
  Filled 2018-10-08 (×5): qty 1

## 2018-10-08 MED ORDER — METOPROLOL TARTRATE 5 MG/5ML IV SOLN
5.0000 mg | INTRAVENOUS | Status: DC | PRN
  Filled 2018-10-08: qty 5

## 2018-10-08 MED ORDER — GLYCOPYRROLATE 0.2 MG/ML IJ SOLN: 0.2000 mg | INTRAMUSCULAR | Status: DC | PRN

## 2018-10-08 MED ORDER — HALOPERIDOL LACTATE 2 MG/ML PO CONC
0.5000 mg | ORAL | Status: DC | PRN
  Filled 2018-10-08: qty 0.3

## 2018-10-08 MED ORDER — HALOPERIDOL 0.5 MG PO TABS
0.5000 mg | ORAL_TABLET | ORAL | Status: DC | PRN
  Filled 2018-10-08: qty 1

## 2018-10-08 MED ORDER — FLUOXETINE HCL 20 MG PO CAPS
20.0000 mg | ORAL_CAPSULE | Freq: Two times a day (BID) | ORAL | Status: DC
  Filled 2018-10-08 (×2): qty 1

## 2018-10-08 MED ORDER — LORAZEPAM 2 MG/ML PO CONC: 1.0000 mg | ORAL | Status: DC | PRN

## 2018-10-08 MED ORDER — LORAZEPAM 1 MG PO TABS: 1.0000 mg | ORAL_TABLET | ORAL | Status: DC | PRN

## 2018-10-08 MED ORDER — HALOPERIDOL LACTATE 5 MG/ML IJ SOLN: 0.5000 mg | INTRAMUSCULAR | Status: DC | PRN

## 2018-10-08 MED ORDER — POLYVINYL ALCOHOL 1.4 % OP SOLN
1.0000 [drp] | Freq: Four times a day (QID) | OPHTHALMIC | Status: DC | PRN
  Filled 2018-10-08: qty 15

## 2018-10-08 MED ORDER — GLYCOPYRROLATE 1 MG PO TABS
1.0000 mg | ORAL_TABLET | ORAL | Status: DC | PRN
  Filled 2018-10-08: qty 1

## 2018-10-08 MED ORDER — ONDANSETRON 4 MG PO TBDP: 4.0000 mg | ORAL_TABLET | Freq: Four times a day (QID) | ORAL | Status: DC | PRN

## 2018-10-08 MED ORDER — ACETAMINOPHEN 650 MG RE SUPP: 650.0000 mg | Freq: Four times a day (QID) | RECTAL | Status: DC | PRN

## 2018-10-08 MED ORDER — ONDANSETRON HCL 4 MG/2ML IJ SOLN: 4.0000 mg | Freq: Four times a day (QID) | INTRAMUSCULAR | Status: DC | PRN

## 2018-10-08 MED ORDER — METOPROLOL TARTRATE 5 MG/5ML IV SOLN
5.0000 mg | Freq: Once | INTRAVENOUS | Status: AC
  Administered 2018-10-08: 5 mg via INTRAVENOUS
  Filled 2018-10-08: qty 5

## 2018-10-08 MED ORDER — MORPHINE SULFATE (CONCENTRATE) 10 MG/0.5ML PO SOLN
5.0000 mg | ORAL | Status: DC | PRN
  Administered 2018-10-09 – 2018-10-11 (×2): 5 mg via SUBLINGUAL
  Filled 2018-10-08 (×2): qty 1

## 2018-10-08 MED ORDER — LORAZEPAM 2 MG/ML IJ SOLN: 1.0000 mg | INTRAMUSCULAR | Status: DC | PRN

## 2018-10-08 MED ORDER — BIOTENE DRY MOUTH MT LIQD: 15.0000 mL | OROMUCOSAL | Status: DC | PRN

## 2018-10-08 MED ORDER — ACETAMINOPHEN 325 MG PO TABS: 650.0000 mg | ORAL_TABLET | Freq: Four times a day (QID) | ORAL | Status: DC | PRN

## 2018-10-08 NOTE — Progress Notes (Signed)
Notified tonight by nursing that family was requesting comfort measures for the patient, but the patient had not been placed on comfort measures.  On chart review it is unclear what conversation today has been about this.  Primary provider's documentation does not specify clearly what plan or decisions were made by family.  Covering provider this afternoon did speak with family, but perhaps did not speak with the family member who is HC POA.  I spoke by phone tonight with Rachel Hayes, who states that she is the patient's daughter and [facility] POA.  Nurse confirms paperwork on file shows the same.  Mrs. Rachel Hayes states that she and her sisters have discussed their mother's care and have decided tonight to pursue comfort measures given the poor prognosis of return to quality of life after this significant infection and hospital stay, and the fact that in order to obtain the best outcome, which is still suboptimal, interventions would have to occur which her mother likely would not have wanted.  Mrs. Rachel Hayes states after our conversation that she understands that we will be withholding further aggressive treatment, including antibiotics and other medications, lab checks, vital signs, etc.  She also understands that any intervention at this point forward will be solely focused on achieving patient comfort.  She states that she and her sisters are in agreement that this is the course of action they wish to pursue for the patient.  Comfort orders placed, along with palliative care consult in case alternate care settings are required in the event that the patient lingers.  [facility] 10/08/2018, 9:55 PM  Note:  This document was prepared using Dragon voice recognition software and may include unintentional dictation errors.

## 2018-10-08 NOTE — Progress Notes (Signed)
Family decided at approximately Rachel Hayes to pursue comfort measures for this patient. Dr. Stark Jock on call and prefers to defer to Dr. Margaretmary Eddy to make the patient comfort measures in the morning rather than changing code status tonight r/t daughter Juliann Pulse [facility]) out of the building. Dr. [name] is at bedside and aware and agreeable with this plan.

## 2018-10-08 NOTE — Progress Notes (Signed)
This RN spoke at length with patient's daughter r/t patient care. Family is concerned about quality of life and whether it would be better to continue current care or move to comfort measures. Patient has been made DNR and they understand this will prevent escalation of care to an extent, but is different than comfort care. This RN explained the differences in DNR vs Comfort Care, as well as hospice vs palliative care.

## 2018-10-08 NOTE — Progress Notes (Signed)
[facility] [place] at [facility]   PATIENT NAME: Rachel Hayes    MR#:  [id number]  DATE OF BIRTH:  16-Sep-1931  SUBJECTIVE:  CHIEF COMPLAINT:  Pt was seen and examined  , more alert and perking up today daughter at bedside Daughter is concerned that patient might be aspirating because of her Parkinson's disease.  Patient is bedbound at her baseline  REVIEW OF SYSTEMS:  Patient is encephalopathic  DRUG ALLERGIES:  No Known Allergies  VITALS:  Blood pressure (!) 165/77, pulse 75, temperature (!) 97.4 F (36.3 C), temperature source Oral, resp. rate (!) 32, height 5\' 1"  (1.549 m), weight 34.5 kg, SpO2 99 %.  PHYSICAL EXAMINATION:  GENERAL:  85 y.o.-year-old patient lying in the bed with no acute distress.  EYES: Pupils equal, round, reactive to light and accommodation. No scleral icterus. Extraocular muscles intact.  HEENT: Head atraumatic, normocephalic. Oropharynx and nasopharynx clear.  NECK:  Supple, no jugular venous distention. No thyroid enlargement, no tenderness.  LUNGS: Diminished breath sounds bilaterally, no wheezing, rales,rhonchi.  Positive crepitation. No use of accessory muscles of respiration.  CARDIOVASCULAR: S1, S2 normal. No murmurs, rubs, or gallops.  ABDOMEN: Soft, nontender, nondistended. Bowel sounds present.  EXTREMITIES: No pedal edema, cyanosis, or clubbing.  NEUROLOGIC:  encephalopathic t. Gait not checked.  PSYCHIATRIC: The patient is encephalopathic SKIN: No obvious rash, lesion, or ulcer.      LABORATORY PANEL:   CBC Recent Labs  Lab 10/07/18 1241  WBC 4.3  HGB 12.7  HCT 38.8  PLT 165   ------------------------------------------------------------------------------------------------------------------  Chemistries  Recent Labs  Lab 10/18/2018 2144 10/07/18 1241  NA 148* 149*  K 3.3* 3.4*  CL 105 111  CO2 25 23  GLUCOSE 138* 94  BUN 79* 85*  CREATININE 3.04* 2.97*  CALCIUM 10.5* 9.7  AST 31  --   ALT 6   --   ALKPHOS 57  --   BILITOT 1.0  --    ------------------------------------------------------------------------------------------------------------------  Cardiac Enzymes No results for input(s): TROPONINI in the last 168 hours. ------------------------------------------------------------------------------------------------------------------  RADIOLOGY:  Dg Chest Port 1 View  Result Date: 10/04/2018 CLINICAL DATA:  Shortness of breath EXAM: PORTABLE CHEST 1 VIEW COMPARISON:  01/03/2018 FINDINGS: Patchy perihilar opacity bilaterally. More confluent consolidation at the left base. Possible small left effusion. Normal heart size. No pneumothorax. Vague lucency over the right upper quadrant. IMPRESSION: 1. Patchy perihilar airspace opacity with more confluent consolidation in the left lung base concerning for multifocal pneumonia. Possible small left effusion 2. Vague lucency over the right upper quadrant of the abdomen, recommend decubitus view to exclude free air. Electronically Signed   By: Jasmine PangKim  Fujinaga M.D.   On: 10/11/2018 21:55    EKG:   Orders placed or performed during the hospital encounter of 10/16/2018  . EKG 12-Lead  . EKG 12-Lead  . ED EKG 12-Lead  . ED EKG 12-Lead    ASSESSMENT AND PLAN:     Severe sepsis (HCC) -sepsis due to multifocal pneumonia and likely urinary tract infection.   Her lactic acid was elevated at around 4 on initial draw.  Repeat lactic acid is at 4.7 Hydrate with IV fluids and monitor.   Blood cultures 1 out of 4 bottles with Streptococcus anginosis Urine culture with no growth IV antibiotics cefepime and vancomycin     AKI (acute kidney injury) (HCC) -IV fluids as above, avoid nephrotoxins and monitor for improvement.  Patient's baseline creatinine was normal in May 2019 currently at 3.0.-2.97  repeat a.m. labs we will consult nephrology if no improvement    UTI (urinary tract infection) -ruled out as urine culture with no growth    Multifocal  pneumonia -IV antibiotics as above, patient is on nonrebreather and maintaining good oxygen saturation with this supplemental oxygen.  Supportive treatment PRN Currently on oxygen to 6 L by nasal cannula will try to wean down as tolerated N.p.o. speech therapy assessment as there is a concern for aspiration    HTN (hypertension) -patient's blood pressure was initially low, avoid antihypertensives for now    Parkinson disease ([place]) -continue home meds when patient is more awake and alert after speech therapy assessment  Sacral decubitus ulcer stage II Reposition patient every 2 hours Dressing to pressure ulcers    All the records are reviewed and case discussed with Care Management/Social Workerr. Management plans discussed with the patient, daughter at bedside and they are in agreement.  CODE STATUS: DNR  TOTAL TIME TAKING CARE OF THIS PATIENT: 35 minutes.   POSSIBLE D/C IN 2-3 DAYS, DEPENDING ON CLINICAL CONDITION.  Note: This dictation was prepared with Dragon dictation along with smaller phrase technology. Any transcriptional errors that result from this process are unintentional.   Nicholes Mango M.D on 10/08/2018 at 1:34 PM  Between 7am to 6pm - Pager - [phone] After 6pm go to [email or web address] - password [email or web address] [facility]  Office  [phone]  CC: Primary care physician; Rusty Aus, MD

## 2018-10-08 NOTE — Progress Notes (Signed)
Discussed with daughter and day shift nurse of family desire for patient's code status to be changed to comfort care. Patient is unresponsive, vital signs WNL, lactic is 4.7 as of 10/07/18. Daughter at bedside is not POA, received a HPOA documentation, scanned/ copy placed in chart. HPOA is stated to be Rachel Hayes, daughter of patient, who is designated to act in the place of Rachel Hayes is unavailable, who family states is deceased. Notified Dr. Jannifer Franklin of family request, Dr. Jannifer Franklin discussed with family/ POA Rachel Hayes. Patient is in no acute distress. Daughter at bedside. Received orders for comfort care/ nurse may pronounce.

## 2018-10-08 NOTE — Plan of Care (Signed)

## 2018-10-08 NOTE — Progress Notes (Signed)
I was called by nursing staff that patient's 3 daughters married and they have decided to keep patient comfortable going forward with comfort care measures only.  I called and spoke with the daughter who was in the room Mrs. Rachel Hayes.  She stated she was 1 of the daughters but she is not a healthcare power of attorney.  Her other sister is a Special educational needs teacher of attorney.  After discussing with [name] over the phone she is agreeable for initiation of comfort care measures in the morning. The primary physician Dr. Margaretmary Eddy with called the other daughter who has healthcare power of attorney to confirm if they wish to proceed with comfort care measures in the morning before initiating comfort care.

## 2018-10-09 ENCOUNTER — Encounter: Payer: Self-pay | Admitting: Primary Care

## 2018-10-09 DIAGNOSIS — Z515 Encounter for palliative care: Secondary | ICD-10-CM

## 2018-10-09 DIAGNOSIS — A419 Sepsis, unspecified organism: Secondary | ICD-10-CM

## 2018-10-09 DIAGNOSIS — Z7189 Other specified counseling: Secondary | ICD-10-CM

## 2018-10-09 DIAGNOSIS — R6520 Severe sepsis without septic shock: Secondary | ICD-10-CM

## 2018-10-09 DIAGNOSIS — G20 Parkinson's disease: Secondary | ICD-10-CM

## 2018-10-09 LAB — CULTURE, BLOOD (ROUTINE X 2)

## 2018-10-09 LAB — BLOOD CULTURE ID PANEL (REFLEXED)

## 2018-10-09 NOTE — Progress Notes (Signed)
PHARMACY - PHYSICIAN COMMUNICATION CRITICAL VALUE ALERT - BLOOD CULTURE IDENTIFICATION (BCID)  Rachel Hayes is an 85 y.o. female who [facility] with a chief complaint of Aspiration/Parkinson's EOL  Assessment:  GPC Aerobic bottle (2nd set) Staph Aureus Mec A detected   Name of physician (or Provider) Contacted: [facility]  Current antibiotics: None (comfort care)  Changes to prescribed antibiotics recommended:  None at this time unless comfort care status changes  Results for orders placed or performed during the hospital encounter of 10/22/2018  Blood Culture ID Panel (Reflexed) (Collected: 10/12/2018  9:44 PM)  Result Value Ref Range   Enterococcus species NOT DETECTED NOT DETECTED   Listeria monocytogenes NOT DETECTED NOT DETECTED   Staphylococcus species NOT DETECTED NOT DETECTED   Staphylococcus aureus (BCID) NOT DETECTED NOT DETECTED   Streptococcus species DETECTED (A) NOT DETECTED   Streptococcus agalactiae NOT DETECTED NOT DETECTED   Streptococcus pneumoniae NOT DETECTED NOT DETECTED   Streptococcus pyogenes NOT DETECTED NOT DETECTED   Acinetobacter baumannii NOT DETECTED NOT DETECTED   Enterobacteriaceae species NOT DETECTED NOT DETECTED   Enterobacter cloacae complex NOT DETECTED NOT DETECTED   Escherichia coli NOT DETECTED NOT DETECTED   Klebsiella oxytoca NOT DETECTED NOT DETECTED   Klebsiella pneumoniae NOT DETECTED NOT DETECTED   Proteus species NOT DETECTED NOT DETECTED   Serratia marcescens NOT DETECTED NOT DETECTED   Haemophilus influenzae NOT DETECTED NOT DETECTED   Neisseria meningitidis NOT DETECTED NOT DETECTED   Pseudomonas aeruginosa NOT DETECTED NOT DETECTED   Candida albicans NOT DETECTED NOT DETECTED   Candida glabrata NOT DETECTED NOT DETECTED   Candida krusei NOT DETECTED NOT DETECTED   Candida parapsilosis NOT DETECTED NOT DETECTED   Candida tropicalis NOT DETECTED NOT [place], PharmD, BCPS Clinical  Pharmacist 10/09/2018 9:56 AM

## 2018-10-09 NOTE — Progress Notes (Signed)
Nutrition Brief Note  Patient identified to be seen for Malnutrition Screening Tool (MST). Chart reviewed. Patient now transitioning to comfort care.   No nutrition interventions warranted at this time. Please consult RD as needed.   Francia Verry, MS, RD, LDN Office: [phone] Pager: [phone] After Hours/Weekend Pager: [phone]    

## 2018-10-09 NOTE — Consult Note (Signed)
Consultation Note Date: 10/09/2018   Patient Name: Rachel Hayes  DOB: 1931/08/15  MRN: [id number]  Age / Sex: 85 y.o., female  PCP: Danella Penton, MD Referring Physician: Ramonita Lab, MD  Reason for Consultation: Establishing goals of care and Terminal Care  HPI/Patient Profile: 85 y.o. female  with past medical history of Parkinson disease, HTN, HLD, UTI admitted on Oct 24, 2018 with UTI.   Clinical Assessment and Goals of Care: Rachel Hayes is resting quietly in bed, surrounded by family.  She is full comfort care. We talk about prognosis and transfer.  [name] asks about staying in the hospital.  At this point Rachel Hayes likely has hours to days and is not safe for transport. Daughter [name] shares that she will discuss with her sisters, but believes they have "no strong feelings" about residential hospice or home with hospice. All questions answered.   Conference with nursing staff related to patient condition, needs, comfort care.   HCPOA  NEXT OF KIN - daughter [name] at bedside.     SUMMARY OF RECOMMENDATIONS   FULL COMFORT CARE   Code Status/Advance Care Planning:  DNR  Symptom Management:   Comfort orders in place  Palliative Prophylaxis:   Aspiration, Frequent Pain Assessment, Oral Care and Turn Reposition  Additional Recommendations (Limitations, Scope, Preferences):  Full Comfort Care  Psycho-social/Spiritual:   Desire for further Chaplaincy support:no  Additional Recommendations: Caregiving  Support/Resources and Education on Hospice  Prognosis:   Hours - Days  Discharge Planning: Anticipated Hospital Death      Primary Diagnoses: Present on Admission: . Severe sepsis (HCC) . AKI (acute kidney injury) (HCC) . UTI (urinary tract infection) . Multifocal pneumonia . HTN (hypertension) . HLD (hyperlipidemia) . Parkinson disease (HCC)   I have reviewed the medical  record, interviewed the patient and family, and examined the patient. The following aspects are pertinent.  Past Medical History:  Diagnosis Date  . Hyperlipemia   . Hypertension   . Parkinson disease [facility])    Social History   Socioeconomic History  . Marital status: Married    Spouse name: Not on file  . Number of children: Not on file  . Years of education: Not on file  . Highest education level: Not on file  Occupational History  . Not on file  Social Needs  . Financial resource strain: Not on file  . Food insecurity    Worry: Not on file    Inability: Not on file  . Transportation needs    Medical: Not on file    Non-medical: Not on file  Tobacco Use  . Smoking status: Never Smoker  . Smokeless tobacco: Never Used  Substance and Sexual Activity  . Alcohol use: No  . Drug use: Not on file  . Sexual activity: Not on file  Lifestyle  . Physical activity    Days per week: Not on file    Minutes per session: Not on file  . Stress: Not on file  Relationships  . Social connections  Talks on phone: Not on file    Gets together: Not on file    Attends religious service: Not on file    Active member of club or organization: Not on file    Attends meetings of clubs or organizations: Not on file    Relationship status: Not on file  Other Topics Concern  . Not on file  Social History Narrative  . Not on file   History reviewed. No pertinent family history. Scheduled Meds: . brinzolamide  1 drop Left Eye BID  . latanoprost  1 drop Left Eye QHS   Continuous Infusions: PRN Meds:.acetaminophen **OR** acetaminophen, antiseptic oral rinse, glycopyrrolate **OR** glycopyrrolate **OR** glycopyrrolate, haloperidol **OR** haloperidol **OR** haloperidol lactate, LORazepam **OR** LORazepam **OR** LORazepam, morphine CONCENTRATE **OR** morphine CONCENTRATE, ondansetron **OR** ondansetron (ZOFRAN) IV, polyvinyl alcohol Medications Prior to Admission:  Prior to Admission  medications   Medication Sig Start Date End Date Taking? Authorizing Provider  bimatoprost (LUMIGAN) 0.01 % SOLN Place 1 drop into the left eye daily.    Yes [provider]  brinzolamide (AZOPT) 1 % ophthalmic suspension Place 1 drop into the left eye 2 (two) times daily.   Yes [provider]  Carbidopa-Levodopa ER 48.75-195 MG CPCR Take 4 capsules by mouth 2 (two) times daily with breakfast and lunch.   Yes [provider]  Carbidopa-Levodopa ER 48.75-195 MG CPCR Take 3 capsules by mouth daily with supper.   Yes [provider]  FLUoxetine (PROZAC) 20 MG tablet Take 20 mg by mouth 2 (two) times daily.  07/13/17  Yes [provider]  Multiple Vitamin (MULTI-VITAMINS) TABS Take 1 tablet by mouth daily.   Yes [provider]  Multiple Vitamins-Minerals (ICAPS) CAPS Take 1 capsule by mouth daily.   Yes [provider]  naproxen (NAPROSYN) 375 MG tablet Take 375 mg by mouth daily.   Yes [provider]  Omega-3 1000 MG CAPS Take 1,000 mg by mouth daily. 01/08/10  Yes [provider]  omeprazole (PRILOSEC) 20 MG capsule Take 20 mg by mouth daily. 10/24/17  Yes [provider]  oxybutynin (DITROPAN) 5 MG tablet Take 5 mg by mouth at bedtime.  10/25/17  Yes [provider]  Loma [place] Calcium 500 MG TABS Take 500 mg by mouth daily.   Yes [provider]  polyethylene glycol powder (GLYCOLAX/MIRALAX) powder Take 17 g by mouth daily as needed for mild constipation.    Yes [provider]   No Known Allergies Review of Systems  Unable to perform ROS: Patient unresponsive    Physical Exam Vitals signs and nursing note reviewed.  Constitutional:      General: She is not in acute distress.    Appearance: She is ill-appearing.  Cardiovascular:     Rate and Rhythm: Normal rate.  Pulmonary:     Effort: Pulmonary effort is normal. No respiratory distress.  Abdominal:     General: Abdomen  is flat. There is no distension.  Skin:    General: Skin is warm.     Vital Signs: BP (!) 157/86 (BP Location: Left Arm)   Pulse 76   Temp (!) 97.4 F (36.3 C) (Oral)   Resp (!) 24   Ht 5\' 1"  (1.549 m)   Wt 34.5 kg   SpO2 96%   BMI 14.37 kg/m  Pain Scale: Faces       SpO2: SpO2: 96 % O2 Device:SpO2: 96 % O2 Flow Rate: .O2 Flow Rate (L/min): 4.5 L/min  IO: Intake/output  summary:   Intake/Output Summary (Last 24 hours) at 10/09/2018 1129 Last data filed at 10/08/2018 1831 Gross per 24 hour  Intake 743.79 ml  Output 400 ml  Net 343.79 ml    LBM: Last BM Date: 10/07/18 Baseline Weight: Weight: 40.8 kg Most recent weight: Weight: 34.5 kg     Palliative Assessment/Data:   Flowsheet Rows     Most Recent Value  Intake Tab  Referral Department  Hospitalist  Unit at Time of Referral  Med/Surg Unit  Palliative Care Primary Diagnosis  Pulmonary  Date Notified  10/08/18  Palliative Care Type  New Palliative care  Reason for referral  End of Life Care Assistance, Clarify Goals of Care  Date of Admission  10/23/2018  Date first seen by Palliative Care  10/09/18  # of days Palliative referral response time  1 Day(s)  # of days IP prior to Palliative referral  2  Clinical Assessment  Palliative Performance Scale Score  10%  Pain Max last 24 hours  Not able to report  Pain Min Last 24 hours  Not able to report  Dyspnea Max Last 24 Hours  Not able to report  Dyspnea Min Last 24 hours  Not able to report  Psychosocial & Spiritual Assessment  Palliative Care Outcomes      Time In: 1100 Time Out: 1130 Time Total: 30 minutes Greater than 50%  of this time was spent counseling and coordinating care related to the above assessment and plan.  Signed by: Katheran Aweasha A Kierria Feigenbaum, NP   Please contact Palliative Medicine Team phone at [phone] for questions and concerns.  For individual provider: See Loretha StaplerAmion

## 2018-10-09 NOTE — Progress Notes (Signed)
ID PROGRESS NOTE  Patient found to have staph aureus bacteremia. Due to patient's wishes for comfort care. Will defer work up and treatment.

## 2018-10-09 NOTE — Progress Notes (Signed)
[facility] [place] [facility]   PATIENT NAME: Rachel Hayes    MR#:  [id number]  DATE OF BIRTH:  13-Oct-1931  SUBJECTIVE:  CHIEF COMPLAINT:  Pt was seen and examined  .  Very lethargic.  Daughter at bedside requesting comfort care.  Discussed with patient's and her daughter [name] healthcare power of attorney over phone, anonymously agreeable for comfort care measures  REVIEW OF SYSTEMS:  Patient is encephalopathic  DRUG ALLERGIES:  No Known Allergies  VITALS:  Blood pressure (!) 157/86, pulse 76, temperature (!) 97.4 F (36.3 C), temperature source Oral, resp. rate (!) 24, height 5\' 1"  (1.549 m), weight 34.5 kg, SpO2 96 %.  PHYSICAL EXAMINATION:  Very limited physical examination  gENERAL:  85 y.o.-year-old patient lying in the bed with no acute distress.  Cachectic EYES: Pupils equal, round, reactive to light and accommodation. No scleral icterus.  HEENT: Head atraumatic, normocephalic. Oropharynx and nasopharynx clear.  NECK:  Supple, no jugular venous distention. No thyroid enlargement, no tenderness.  LUNGS: Diminished breath sounds bilaterally, no wheezing, rales,rhonchi.  Positive crepitation. No use of accessory muscles of respiration.  CARDIOVASCULAR: S1, S2 normal. No murmurs, rubs, or gallops.  ABDOMEN: Soft, nontender, nondistended. Bowel sounds present.  NEUROLOGIC:  encephalopathic t. Gait not checked.  PSYCHIATRIC: The patient is encephalopathic SKIN: No obvious rash, lesion, or ulcer.      LABORATORY PANEL:   CBC Recent Labs  Lab 10/07/18 1241  WBC 4.3  HGB 12.7  HCT 38.8  PLT 165   ------------------------------------------------------------------------------------------------------------------  Chemistries  Recent Labs  Lab Sep 16, 2018 2144 10/07/18 1241  NA 148* 149*  K 3.3* 3.4*  CL 105 111  CO2 25 23  GLUCOSE 138* 94  BUN 79* 85*  CREATININE 3.04* 2.97*  CALCIUM 10.5* 9.7  AST 31  --   ALT 6  --    ALKPHOS 57  --   BILITOT 1.0  --    ------------------------------------------------------------------------------------------------------------------  Cardiac Enzymes No results for input(s): TROPONINI in the last 168 hours. ------------------------------------------------------------------------------------------------------------------  RADIOLOGY:  No results found.  EKG:   Orders placed or performed during the hospital encounter of Sep 16, 2018  . EKG 12-Lead  . EKG 12-Lead  . ED EKG 12-Lead  . ED EKG 12-Lead  . EKG 12-Lead  . EKG 12-Lead    ASSESSMENT AND PLAN:   Adult failure to thrive Strict comfort care measures Seen by palliative care and is getting hospital death  Severe sepsis (HCC) -sepsis due to multifocal pneumonia and likely urinary tract infection.  Blood cultures 1 out of 4 bottles with Streptococcus anginosis Urine culture with no growth IV antibiotics cefepime and vancomycin discontinued as the patient is comfort care    AKI (acute kidney injury) (HCC) -IV fluids as above, avoid nephrotoxins and monitor for improvement.  Patient's baseline creatinine was normal in May 2019 currently at 3.0.-2.97   UTI (urinary tract infection) -ruled out as urine culture with no growth    Multifocal pneumonia -IV antibiotics as above, patient is on nonrebreather and maintaining good oxygen saturation with this supplemental oxygen.  Supportive treatment PRN Currently on oxygen to 6 L by nasal cannula will try to wean down as tolerated N.p.o. speech therapy assessment as there is a concern for aspiration    HTN (hypertension) -    Parkinson disease (HCC) -  Sacral decubitus ulcer stage II     All the records are reviewed and case discussed with Care Management/Social Workerr. Management plans discussed with  the patient, daughter at bedside and [place] of attorney they are in agreement with palliative care consult and comfort care measures   CODE STATUS: DNR  TOTAL TIME TAKING CARE OF THIS PATIENT: 53minutes.     Note: This dictation was prepared with Dragon dictation along with smaller phrase technology. Any transcriptional errors that result from this process are unintentional.   Nicholes Mango M.D on 10/09/2018 at 12:24 PM  Between 7am to 6pm - Pager - [phone] After 6pm go to [email or web address] - password [email or web address] [place] [facility]  Office  [phone]  CC: Primary care physician; Rusty Aus, MD

## 2018-10-10 NOTE — Progress Notes (Addendum)
[facility] [place] at [facility]   PATIENT NAME: Rachel Hayes    MR#:  [id number]  DATE OF BIRTH:  Jul 22, 1931  SUBJECTIVE:  CHIEF COMPLAINT:  Pt was seen and examined  .  3 daughters at bedside  On comfort care measures  REVIEW OF SYSTEMS:  Patient is encephalopathic  DRUG ALLERGIES:  No Known Allergies  VITALS:  Blood pressure (!) 157/86, pulse 76, temperature (!) 97.4 F (36.3 C), temperature source Oral, resp. rate (!) 24, height 5\' 1"  (1.549 m), weight 34.5 kg, SpO2 96 %.  PHYSICAL EXAMINATION:  Very limited physical examination  gENERAL:  85 y.o.-year-old patient lying in the bed with no acute distress.  Cachectic EYES: Pupils equal, round, reactive to light and accommodation. No scleral icterus.  HEENT: Head atraumatic, normocephalic. Oropharynx and nasopharynx clear.  NECK:  Supple, no jugular venous distention. No thyroid enlargement, no tenderness.  LUNGS: Diminished breath sounds bilaterally, no wheezing, rales,rhonchi.  Positive crepitation. No use of accessory muscles of respiration.  CARDIOVASCULAR: S1, S2 normal. No murmurs, rubs, or gallops.  ABDOMEN: Soft, nontender, nondistended. Bowel sounds present.  NEUROLOGIC:  encephalopathic t. Gait not checked.  PSYCHIATRIC: The patient is encephalopathic SKIN: No obvious rash, lesion, or ulcer.      LABORATORY PANEL:   CBC Recent Labs  Lab 10/07/18 1241  WBC 4.3  HGB 12.7  HCT 38.8  PLT 165   ------------------------------------------------------------------------------------------------------------------  Chemistries  Recent Labs  Lab 10/16/2018 2144 10/07/18 1241  NA 148* 149*  K 3.3* 3.4*  CL 105 111  CO2 25 23  GLUCOSE 138* 94  BUN 79* 85*  CREATININE 3.04* 2.97*  CALCIUM 10.5* 9.7  AST 31  --   ALT 6  --   ALKPHOS 57  --   BILITOT 1.0  --    ------------------------------------------------------------------------------------------------------------------   Cardiac Enzymes No results for input(s): TROPONINI in the last 168 hours. ------------------------------------------------------------------------------------------------------------------  RADIOLOGY:  No results found.  EKG:   Orders placed or performed during the hospital encounter of 10/20/2018  . EKG 12-Lead  . EKG 12-Lead  . ED EKG 12-Lead  . ED EKG 12-Lead  . EKG 12-Lead  . EKG 12-Lead    ASSESSMENT AND PLAN:   Adult failure to thrive Strict comfort care measures Seen by palliative care and anticipating hospital death 3 daughters at bedside  Severe sepsis (HCC) -sepsis due to multifocal pneumonia and likely urinary tract infection.  Blood cultures 1 out of 4 bottles with Streptococcus anginosis Urine culture with no growth IV antibiotics cefepime and vancomycin discontinued as the patient is comfort care    AKI (acute kidney injury) (HCC) -IV fluids as above, avoid nephrotoxins and monitor for improvement.  Patient's baseline creatinine was normal in May 2019 currently at 3.0.-2.97   UTI (urinary tract infection) -ruled out as urine culture with no growth    Multifocal pneumonia -IV antibiotics as above, patient is on nonrebreather and maintaining good oxygen saturation with this supplemental oxygen.  Supportive treatment PRN Currently on oxygen to 6 L by nasal cannula will try to wean down as tolerated N.p.o. speech therapy assessment as there is a concern for aspiration    HTN (hypertension) -    Parkinson disease (HCC) -  Sacral decubitus ulcer stage II     All the records are reviewed and case discussed with Care Management/Social Workerr. Management plans discussed with the patient, daughter at bedside and [name] healthcare power of attorney they are in agreement with palliative  care consult and comfort care measures  CODE STATUS: DNR  TOTAL TIME TAKING CARE OF THIS PATIENT: 28 minutes.     Note: This dictation was prepared with Dragon  dictation along with smaller phrase technology. Any transcriptional errors that result from this process are unintentional.   Nicholes Mango M.D on 10/10/2018 at 12:34 PM  Between 7am to 6pm - Pager - [phone] After 6pm go to [email or web address] - password [email or web address] [place] [facility]  Office  [phone]  CC: Primary care physician; Rusty Aus, MD

## 2018-10-10 NOTE — Progress Notes (Signed)
Palliative: Rachel Hayes is lying quietly in bed.  She is surrounded by her 3 daughters, bedside nursing staff is present.  She appears relatively comfortable, actively dying.  We talked about taking this time as an "life review".  We talked in detail about symptom management, how to determine if Rachel Hayes needs additional medication.  We talked about anticipated in hospital death, her condition is to tenuous for transfer. Family reassured, questions answered.  Conference with bedside nursing staff, RN case management, related to patient condition, needs, anticipated hospital death.  Plan:  FULL COMFORT CARE Prognosis:   Hours to days, anticipate hospital death, too unstable to transfer.   60 minutes Quinn Axe, NP Palliative Medicine Team Team Phone # [phone] Greater than 50% of this time was spent counseling and coordinating care related to the above assessment and plan.

## 2018-10-10 NOTE — Care Management Important Message (Signed)
Important Message  Patient Details  Name: Rachel Hayes MRN: [id number] Date of Birth: 1931-04-06   Medicare Important Message Given:  Yes     Juliann Pulse A Stiles Maxcy 10/10/2018, 10:18 AM

## 2018-10-11 LAB — CULTURE, BLOOD (ROUTINE X 2): Special Requests: ADEQUATE

## 2018-10-11 MED ORDER — MORPHINE SULFATE (CONCENTRATE) 10 MG/0.5ML PO SOLN
10.0000 mg | Freq: Four times a day (QID) | ORAL | Status: DC
  Administered 2018-10-11 – 2018-10-12 (×3): 10 mg via ORAL
  Filled 2018-10-11 (×3): qty 1

## 2018-10-11 MED ORDER — MORPHINE SULFATE (PF) 2 MG/ML IV SOLN
2.0000 mg | Freq: Four times a day (QID) | INTRAVENOUS | Status: DC
  Administered 2018-10-12 (×2): 2 mg via INTRAVENOUS
  Filled 2018-10-11 (×2): qty 1

## 2018-10-11 NOTE — Progress Notes (Signed)
[place] at [place] NAME: Rachel Hayes    MR#:  [id number]  DATE OF BIRTH:  [birth year]  SUBJECTIVE:  CHIEF COMPLAINT:  Pt was seen and examined  .  Family at bedside  On comfort care measures  REVIEW OF SYSTEMS:  Patient is encephalopathic  DRUG ALLERGIES:  No Known Allergies  VITALS:  Blood pressure (!) 143/95, pulse (!) 114, temperature 97.8 F (36.6 C), temperature source Axillary, resp. rate 19, height 5\' 1"  (1.549 m), weight 34.5 kg, SpO2 95 %.  PHYSICAL EXAMINATION:  Very limited physical examination  gENERAL:  85 y.o.-year-old patient lying in the bed with no acute distress.  Cachectic EYES: Pupils equal, round, reactive to light and accommodation. No scleral icterus.  HEENT: Head atraumatic, normocephalic. Oropharynx and nasopharynx clear.  NECK:  Supple, no jugular venous distention. No thyroid enlargement, no tenderness.  LUNGS: Diminished breath sounds bilaterally, no wheezing, rales,rhonchi.  Positive crepitation. No use of accessory muscles of respiration.  CARDIOVASCULAR: S1, S2 normal. No murmurs, rubs, or gallops.  ABDOMEN: Soft, nontender, nondistended. Bowel sounds present.  NEUROLOGIC:  encephalopathic t. Gait not checked.  PSYCHIATRIC: The patient is encephalopathic SKIN: No obvious rash, lesion, or ulcer.      LABORATORY PANEL:   CBC Recent Labs  Lab 10/07/18 1241  WBC 4.3  HGB 12.7  HCT 38.8  PLT 165   ------------------------------------------------------------------------------------------------------------------  Chemistries  Recent Labs  Lab 08-Oct-2018 2144 10/07/18 1241  NA 148* 149*  K 3.3* 3.4*  CL 105 111  CO2 25 23  GLUCOSE 138* 94  BUN 79* 85*  CREATININE 3.04* 2.97*  CALCIUM 10.5* 9.7  AST 31  --   ALT 6  --   ALKPHOS 57  --   BILITOT 1.0  --    ------------------------------------------------------------------------------------------------------------------   Cardiac Enzymes No results for input(s): TROPONINI in the last 168 hours. ------------------------------------------------------------------------------------------------------------------  RADIOLOGY:  No results found.  EKG:   Orders placed or performed during the hospital encounter of 10-08-18  . EKG 12-Lead  . EKG 12-Lead  . ED EKG 12-Lead  . ED EKG 12-Lead  . EKG 12-Lead  . EKG 12-Lead    ASSESSMENT AND PLAN:   Adult failure to thrive Strict comfort care measures Seen by palliative care and anticipating hospital death  Severe sepsis ([place]) -sepsis due to multifocal pneumonia and likely urinary tract infection.  Blood cultures 1 out of 4 bottles with Streptococcus anginosis Urine culture with no growth IV antibiotics cefepime and vancomycin discontinued as the patient is comfort care    AKI (acute kidney injury) ([place]) -IV fluids as above, avoid nephrotoxins and monitor for improvement.  Patient's baseline creatinine was normal in May 2019 currently at 3.0.-2.97   UTI (urinary tract infection) -ruled out as urine culture with no growth    Multifocal pneumonia -IV antibiotics as above, patient is on nonrebreather and maintaining good oxygen saturation with this supplemental oxygen.  Supportive treatment PRN Currently on oxygen to 6 L by nasal cannula will try to wean down as tolerated N.p.o. speech therapy assessment as there is a concern for aspiration    HTN (hypertension) -    Parkinson disease ([place]) -  Sacral decubitus ulcer stage II     All the records are reviewed and case discussed with Care Management/Social Workerr. Management plans discussed with the patient, daughter at bedside and Philippa Sicks healthcare power of attorney they are in agreement with palliative care consult and comfort care measures  CODE STATUS: DNR  TOTAL TIME TAKING CARE OF THIS PATIENT: 25 minutes.     Note: This dictation was prepared with Dragon dictation along with smaller  phrase technology. Any transcriptional errors that result from this process are unintentional.   Ramonita LabAruna Cara Aguino M.D on 10/11/2018 at 2:07 PM  Between 7am to 6pm - Pager - [phone] After 6pm go to [email or web address] - password [email or web address] [facility]  [facility]  Office  [phone]  CC: Primary care physician; Danella PentonMiller, Mark F, MD

## 2018-10-12 MED ORDER — MORPHINE SULFATE (CONCENTRATE) 10 MG/0.5ML PO SOLN
10.0000 mg | Freq: Four times a day (QID) | ORAL | Status: DC
  Administered 2018-10-12: 19:00:00 10 mg via ORAL
  Filled 2018-10-12: qty 0.5

## 2018-10-12 NOTE — Progress Notes (Signed)
Palliative: Rachel Hayes is lying quietly in bed, she appears to be actively dying. Daughter Rachel Hayes is at bedside.  We talk about symptom management and comfort, prognosis.   Conference with bedside nursing staff related to patient condition and needs.   Plan:  FULL comfort care, hours to days, anticipate hospital death.   83 minutes Quinn Axe, NP Palliative Medicine Team Team Phone # [phone] Greater than 50% of this time was spent counseling and coordinating care related to the above assessment and plan.

## 2018-10-12 NOTE — Progress Notes (Signed)
[facility] [place] at [facility]   PATIENT NAME: Rachel Hayes    MR#:  [id number]  DATE OF BIRTH:  [birth year]  SUBJECTIVE:  CHIEF COMPLAINT:  Pt was seen and examined  .  Rachel Hayes at bedside  On comfort care measures, resting comfortably  REVIEW OF SYSTEMS:  Patient is encephalopathic  DRUG ALLERGIES:  No Known Allergies  VITALS:  Blood pressure (!) 143/95, pulse (!) 114, temperature 97.8 F (36.6 C), temperature source Axillary, resp. rate 19, height 5\' 1"  (1.549 m), weight 34.5 kg, SpO2 95 %.  PHYSICAL EXAMINATION:  Very limited physical examination  gENERAL:  85 y.o.-year-old patient lying in the bed with no acute distress.  Cachectic EYES: Pupils equal, round, reactive to light and accommodation. No scleral icterus.  HEENT: Head atraumatic, normocephalic. Oropharynx and nasopharynx clear.  NECK:  Supple, no jugular venous distention. No thyroid enlargement, no tenderness.  LUNGS: Diminished breath sounds bilaterally, no wheezing, rales,rhonchi.  Positive crepitation. No use of accessory muscles of respiration.  CARDIOVASCULAR: S1, S2 normal. No murmurs, rubs, or gallops.  ABDOMEN: Soft, nontender, nondistended. Bowel sounds present.  NEUROLOGIC:  encephalopathic t. Gait not checked.  PSYCHIATRIC: The patient is encephalopathic SKIN: No obvious rash, lesion, or ulcer.      LABORATORY PANEL:   CBC Recent Labs  Lab 10/07/18 1241  WBC 4.3  HGB 12.7  HCT 38.8  PLT 165   ------------------------------------------------------------------------------------------------------------------  Chemistries  Recent Labs  Lab 10/05/2018 2144 10/07/18 1241  NA 148* 149*  K 3.3* 3.4*  CL 105 111  CO2 25 23  GLUCOSE 138* 94  BUN 79* 85*  CREATININE 3.04* 2.97*  CALCIUM 10.5* 9.7  AST 31  --   ALT 6  --   ALKPHOS 57  --   BILITOT 1.0  --     ------------------------------------------------------------------------------------------------------------------  Cardiac Enzymes No results for input(s): TROPONINI in the last 168 hours. ------------------------------------------------------------------------------------------------------------------  RADIOLOGY:  No results found.  EKG:   Orders placed or performed during the hospital encounter of 10/17/2018  . EKG 12-Lead  . EKG 12-Lead  . ED EKG 12-Lead  . ED EKG 12-Lead  . EKG 12-Lead  . EKG 12-Lead    ASSESSMENT AND PLAN:   Adult failure to thrive Strict comfort care measures Seen by palliative care and anticipating hospital death  Severe sepsis (HCC) -sepsis due to multifocal pneumonia and likely urinary tract infection.  Blood cultures 1 out of 4 bottles with Streptococcus anginosis Urine culture with no growth IV antibiotics cefepime and vancomycin discontinued as the patient is comfort care    AKI (acute kidney injury) (HCC) -status post IV fluids  UTI (urinary tract infection) -ruled out as urine culture with no growth    Multifocal pneumonia -    HTN (hypertension) -    Parkinson disease (HCC) -  Sacral decubitus ulcer stage II     All the records are reviewed and case discussed with Care Management/Social Workerr. Management plans discussed with the patient, Rachel Hayes at bedside and Rachel Hayes healthcare power of attorney they are in agreement with palliative care consult and comfort care measures  CODE STATUS: DNR  TOTAL TIME TAKING CARE OF THIS PATIENT: 23 minutes.     Note: This dictation was prepared with Dragon dictation along with smaller phrase technology. Any transcriptional errors that result from this process are unintentional.   Rachel Hayes M.D on 10/12/2018 at 10:10 AM  Between 7am to 6pm - Pager - [phone] After 6pm go to  [email or web address] - password [email or web address] [facility]  Office  [phone]  CC: Primary  care physician; Rachel Aus, MD

## 2018-11-02 NOTE — Progress Notes (Signed)
[employer]. Reference #[id number]; Rachel Hayes. Pt ruled out d/t age and weight and is not candidate for donation.

## 2018-11-02 NOTE — Progress Notes (Signed)
   10/27/2018 0100  Clinical Encounter Type  Visited With Patient  Visit Type Initial;Death  Referral From Nurse  Spiritual Encounters  Spiritual Needs Prayer

## 2018-11-02 NOTE — Discharge Summary (Signed)
Date of admission 10/07/2018  Date of death Oct 16, 2018   hpi  Jayleena Stille  is a 85 y.o. female who presents with chief complaint as above.  Patient brought to the ED by family for increased alteration of mental status.  She has Parkinson disease at baseline, but has been much more somnolent for the last couple of days.  On arrival at the ED she was found to be somewhat hypoxic as well.  On work-up she is noted to meet severe sepsis criteria, and has multifocal pneumonia on imaging.  She also potentially has a UTI based on her UA findings.  Hospitalist were called for admission  Adult failure to thrive Strict comfort care measures Seen by palliative care and anticipated  hospital death  Severe sepsis ([place]) -sepsis due to multifocal pneumonia and likely urinary tract infection. Blood cultures 1 out of 4 bottles with Streptococcus anginosis Urine culture with no growth IV antibiotics cefepime and vancomycin discontinued as the patient is comfort care  AKI (acute kidney injury) ([place]) -status post IV fluids  UTI (urinary tract infection) -ruled out as urine culture with no growth  Multifocal pneumonia -  HTN (hypertension) -  Parkinson disease ([place]) -  Sacral decubitus ulcer stage II  Patient was discharged on 16-Oct-2018 comfortably

## 2018-11-02 DEATH — deceased

## 2018-12-20 IMAGING — CT CT ANGIO CHEST
2 of 6 series · 18 of 46 positions shown · IV contrast (APPLIED)
Comparison: Current chest radiograph

CLINICAL DATA: Pt brought in by [facility] from home for chest pain that
started this morning, pain is worse with deep breath in. Pt rates
pain [DATE], pt denies radiation of pain or any other symptoms

EXAM:
CT ANGIOGRAPHY CHEST WITH CONTRAST
TECHNIQUE: Multidetector CT imaging of the chest was performed using the
standard protocol during bolus administration of intravenous
contrast. Multiplanar CT image reconstructions and MIPs were
obtained to evaluate the vascular anatomy.
CONTRAST:  75 mL of Isovue 370 intravenous contrast

[Series 5: thins · axial · 0.69mm/px · z∈[-835,-625]mm · 16 of 231 slices shown]
[im 11/231  lung]
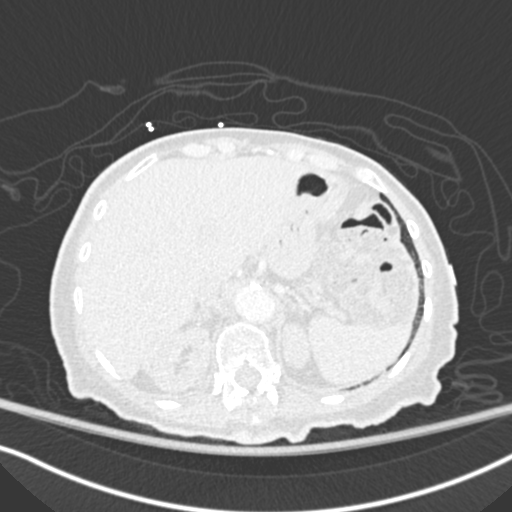
[im 31/231  soft-tissue]
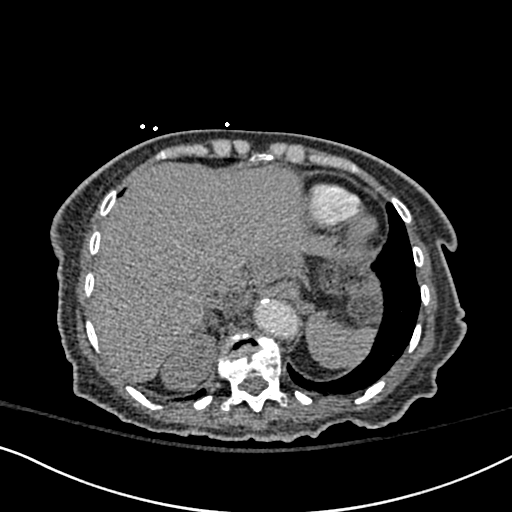
[im 41/231  lung]
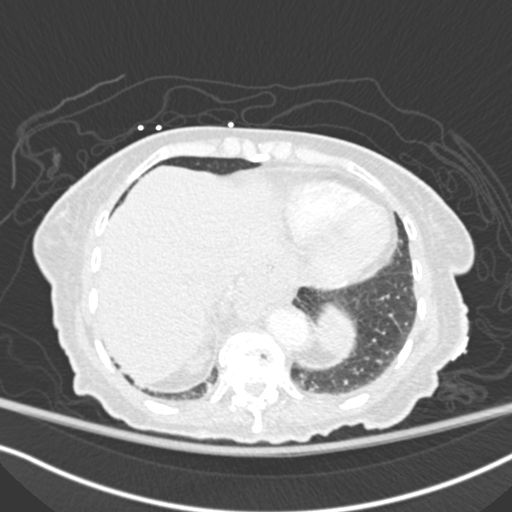
[im 51/231  soft-tissue]
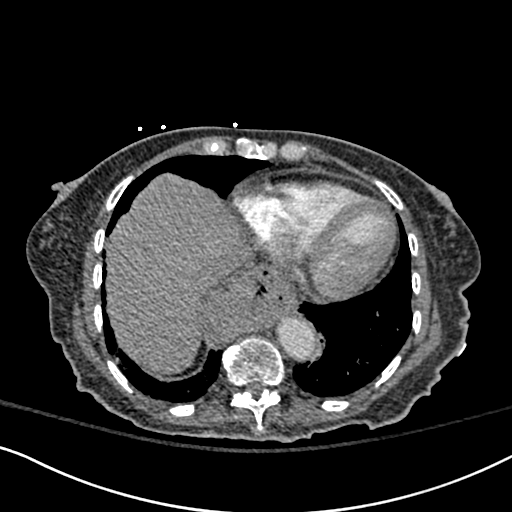
[im 71/231  lung]
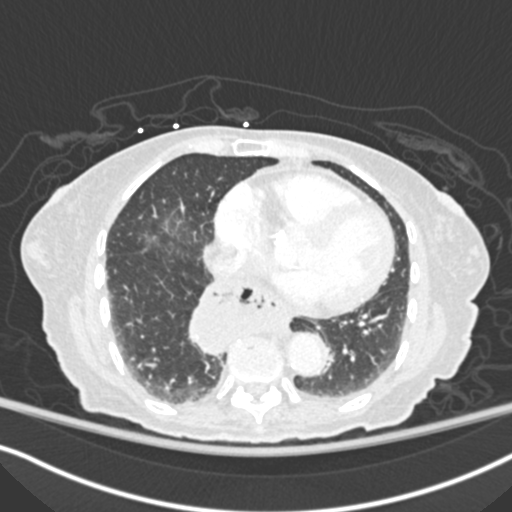
[im 81/231  soft-tissue]
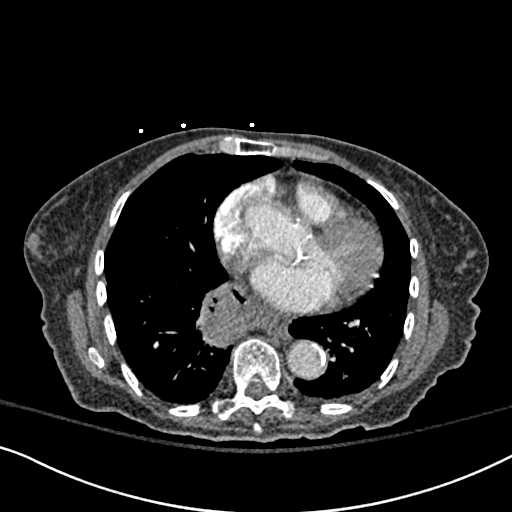
[im 91/231  lung]
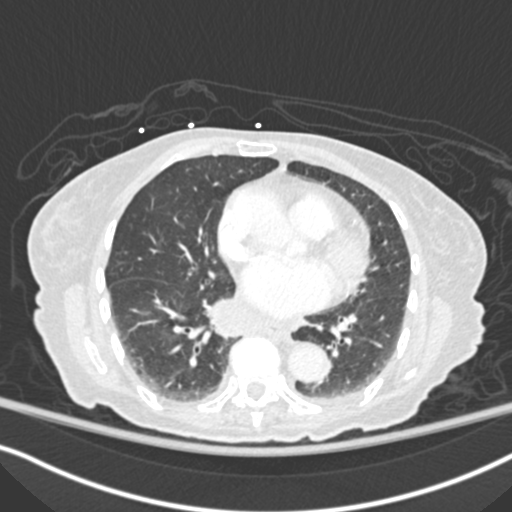
[im 111/231  soft-tissue]
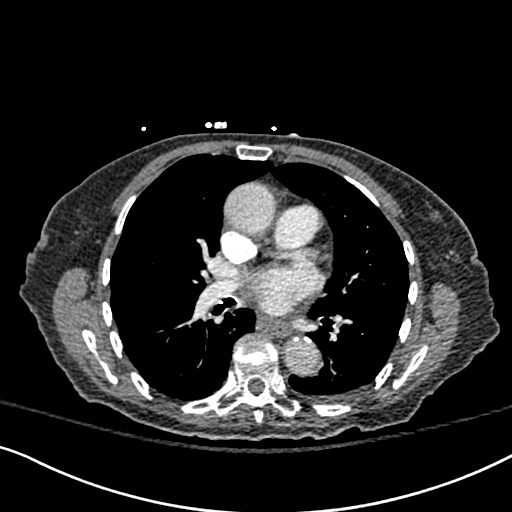
[im 121/231  lung]
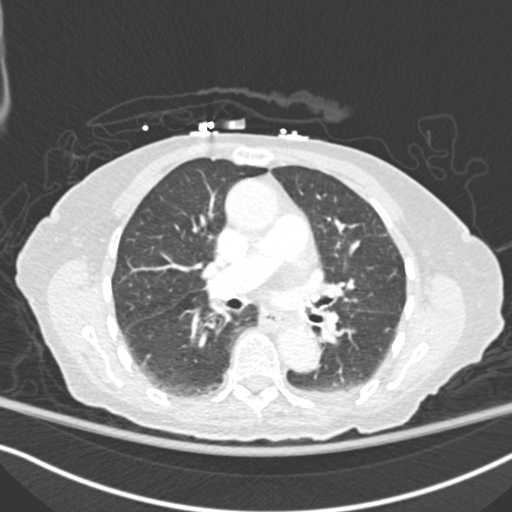
[im 141/231  soft-tissue]
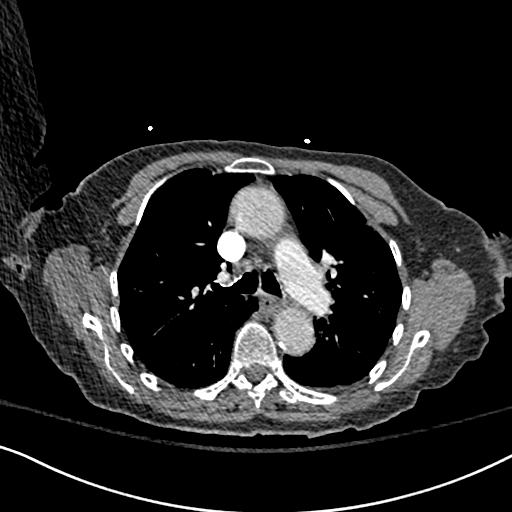
[im 151/231  lung]
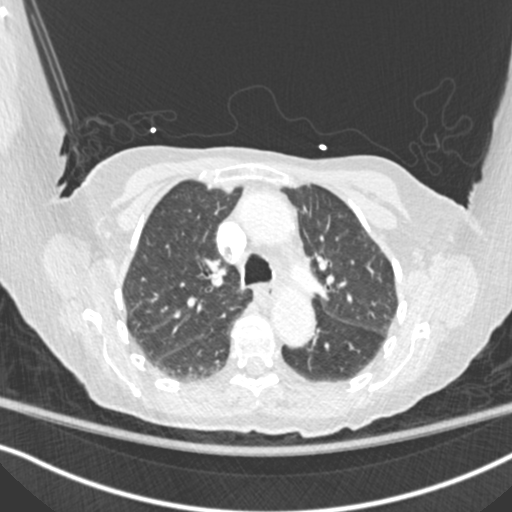
[im 161/231  soft-tissue]
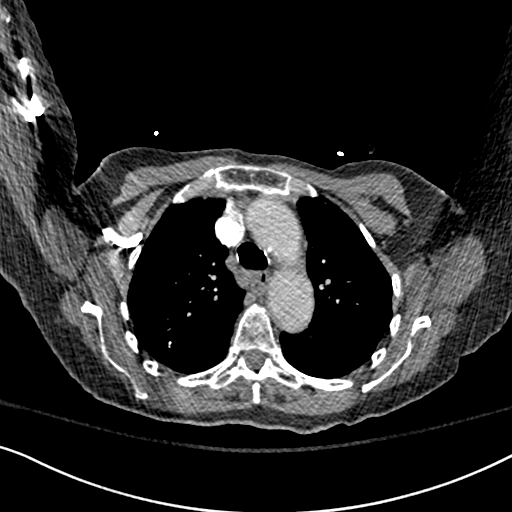
[im 181/231  lung]
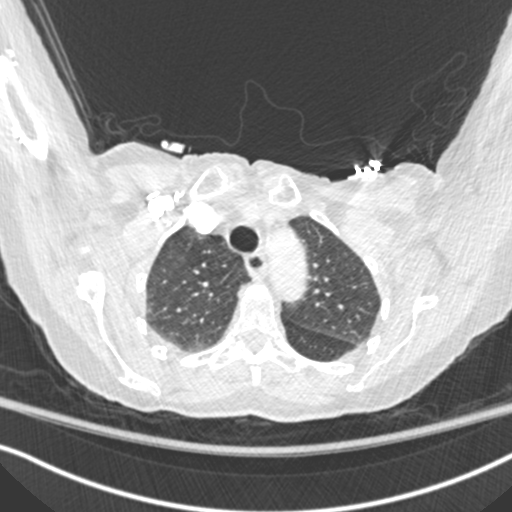
[im 191/231  soft-tissue]
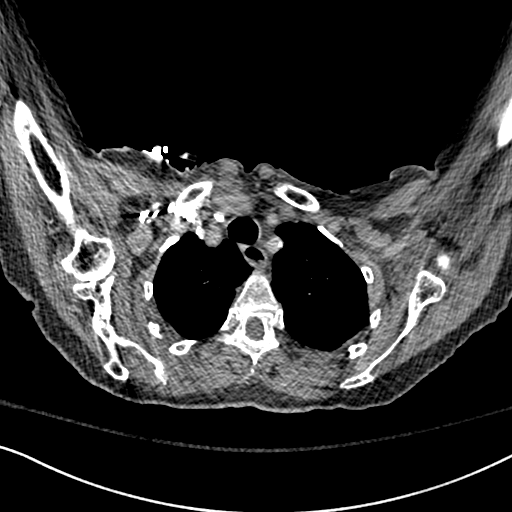
[im 201/231  lung]
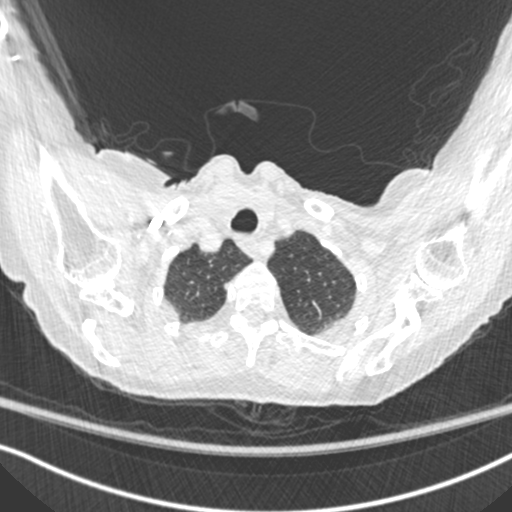
[im 221/231  soft-tissue]
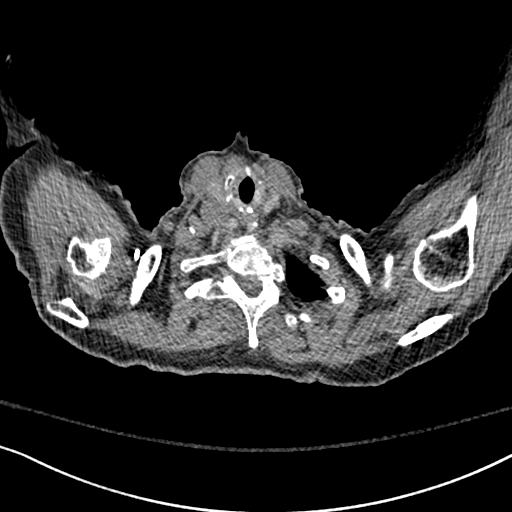

[Series 7: coronal mpr · coronal · 0.49mm/px · 2 of 69 slices shown]
[im 23/69  soft-tissue]
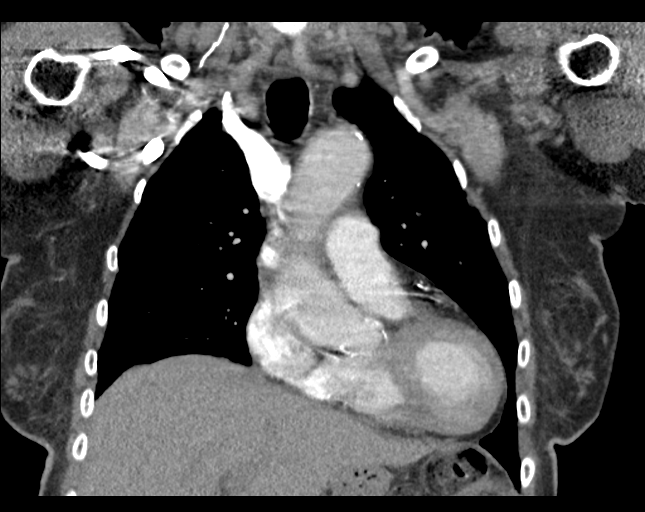
[im 46/69  soft-tissue]
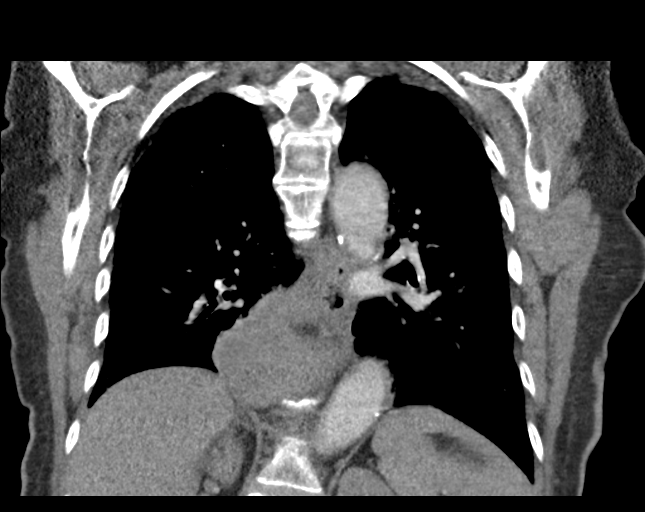

[18 of 46 positions shown; findings below may reference images not displayed]

FINDINGS: Cardiovascular: Contrast opacification of the smaller segmental and
subsegmental pulmonary arteries is suboptimal limiting their
assessment. The larger vessels are well opacified. There is no
evidence of a central pulmonary embolus.

The heart is mildly enlarged. There are moderate coronary artery
calcifications. There is mild atherosclerosis along the thoracic
aorta and at the origin of the branch vessels. No significant
stenosis. No aortic dissection.

Mediastinum/Nodes: Moderate hiatal hernia. No mediastinal or hilar
masses. No enlarged lymph nodes. No neck base or axillary masses or
adenopathy. Trachea is widely patent.

Lungs/Pleura: No evidence of pneumonia or pulmonary edema. There
scattered areas of mild lung scarring. Small dense nodules are noted
in the right upper lobe consistent healed granuloma. There is minor
dependent subsegmental atelectasis. No pleural effusion. No
pneumothorax.

Upper Abdomen: No acute abnormality.

Musculoskeletal: Mild compression fracture of T8. Moderate chronic
appearing compression fracture of T11. No other fractures. No
osteoblastic or osteolytic lesions. Arthropathic changes are noted
of the right shoulder.

Review of the MIP images confirms the above findings.
IMPRESSION: 1. Exam is somewhat limited for the assessment of smaller segmental
or subsegmental pulmonary emboli. Allowing for this limitation,
there is no evidence of a pulmonary embolus.
2. Moderate size hiatal hernia.
3. Mild cardiomegaly with moderate coronary artery calcifications.
4. Mild compression fracture of T8 of unclear chronicity. Chronic
appearing moderate compression fracture of T11
5. No acute findings in the lungs.

## 2020-08-01 IMAGING — CR DG CHEST 1V
1 series · 1 of 1 positions shown · non-contrast
Comparison: PA and lateral chest x-ray May 23, 2016

CLINICAL DATA: fall, weakness; history of falls and weakness.

EXAM:
CHEST  1 VIEW

[dg chest 1 view]
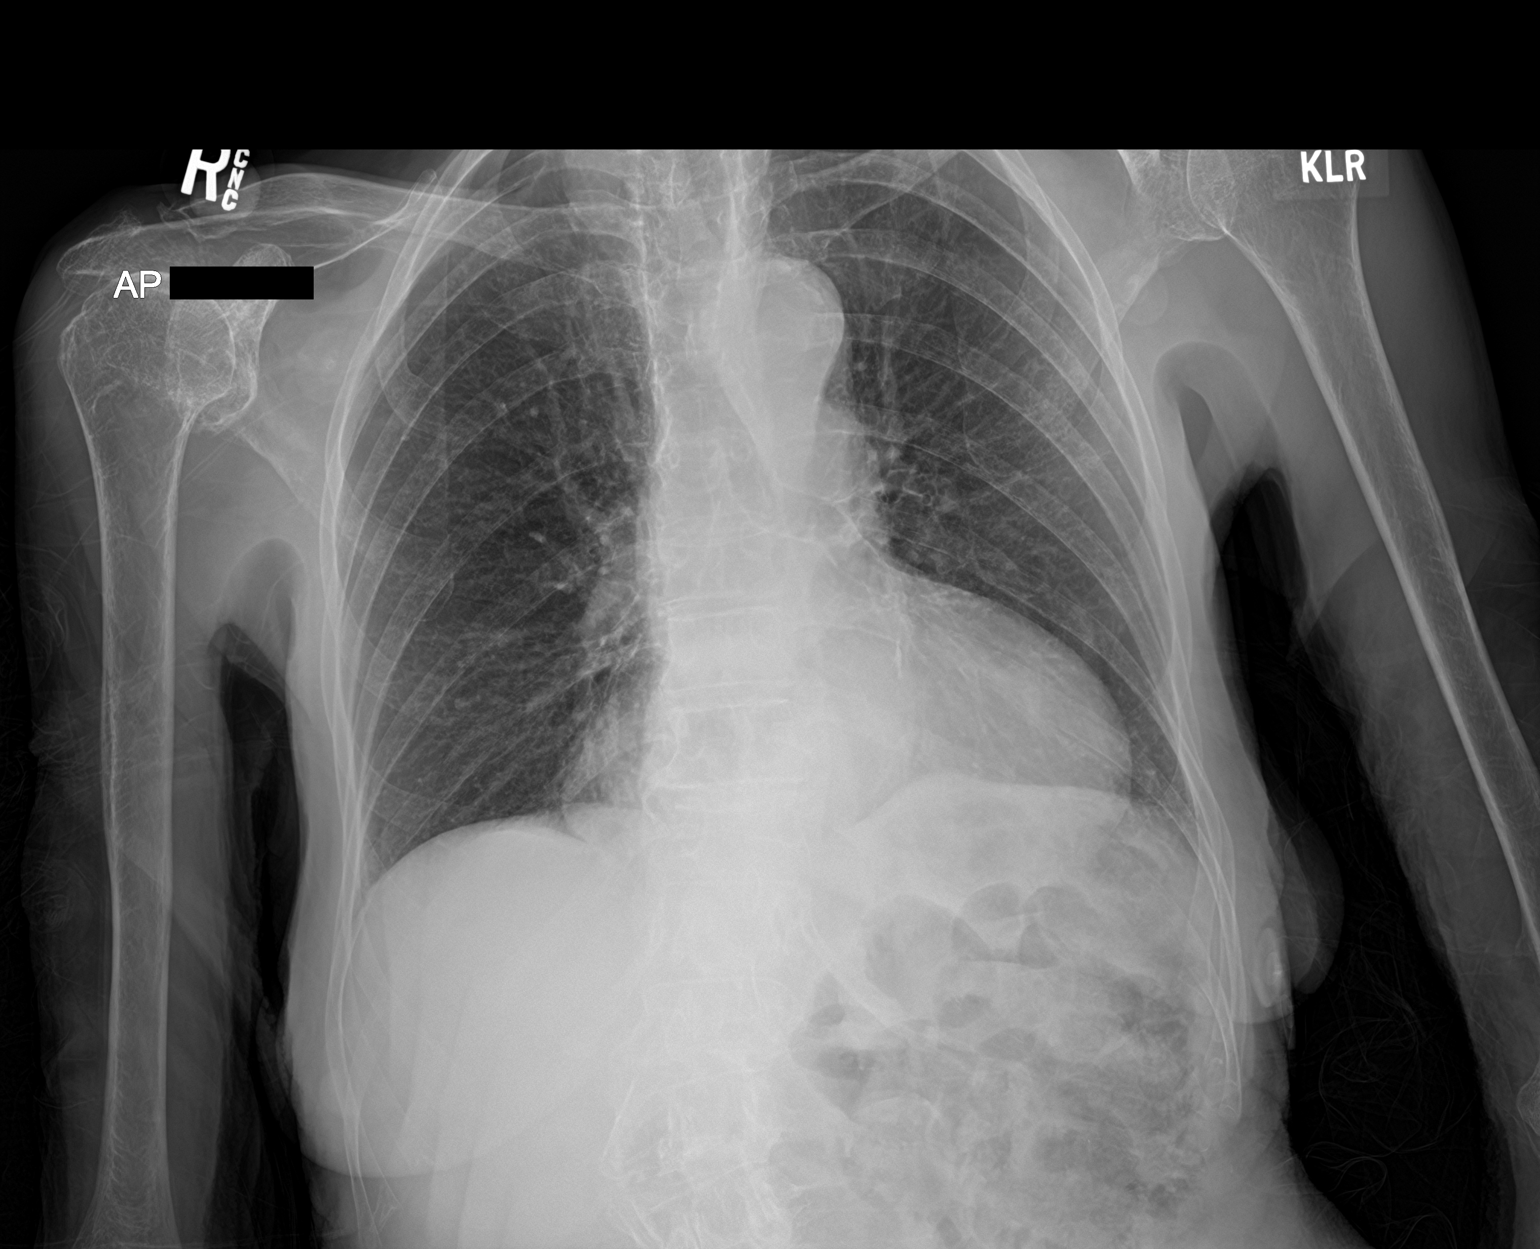

[1 of 1 positions shown; findings below may reference images not displayed]

FINDINGS: The lungs are adequately inflated. There is no focal infiltrate.
There is a small hiatal hernia. The heart is top-normal in size but
stable. The pulmonary vascularity is normal. The mediastinum is
normal in width. The bony thorax exhibits no acute abnormality.
There is chronic wedge compression of T11.
IMPRESSION: Mild chronic bronchitic changes. Stable borderline cardiomegaly. No
CHF nor pneumonia.

Moderate-sized hiatal hernia.

## 2021-05-04 IMAGING — DX DG CHEST 1V PORT
1 series · 1 of 1 positions shown · non-contrast
Comparison: 01/03/2018

CLINICAL DATA: Shortness of breath

EXAM:
PORTABLE CHEST 1 VIEW

[chest ap]
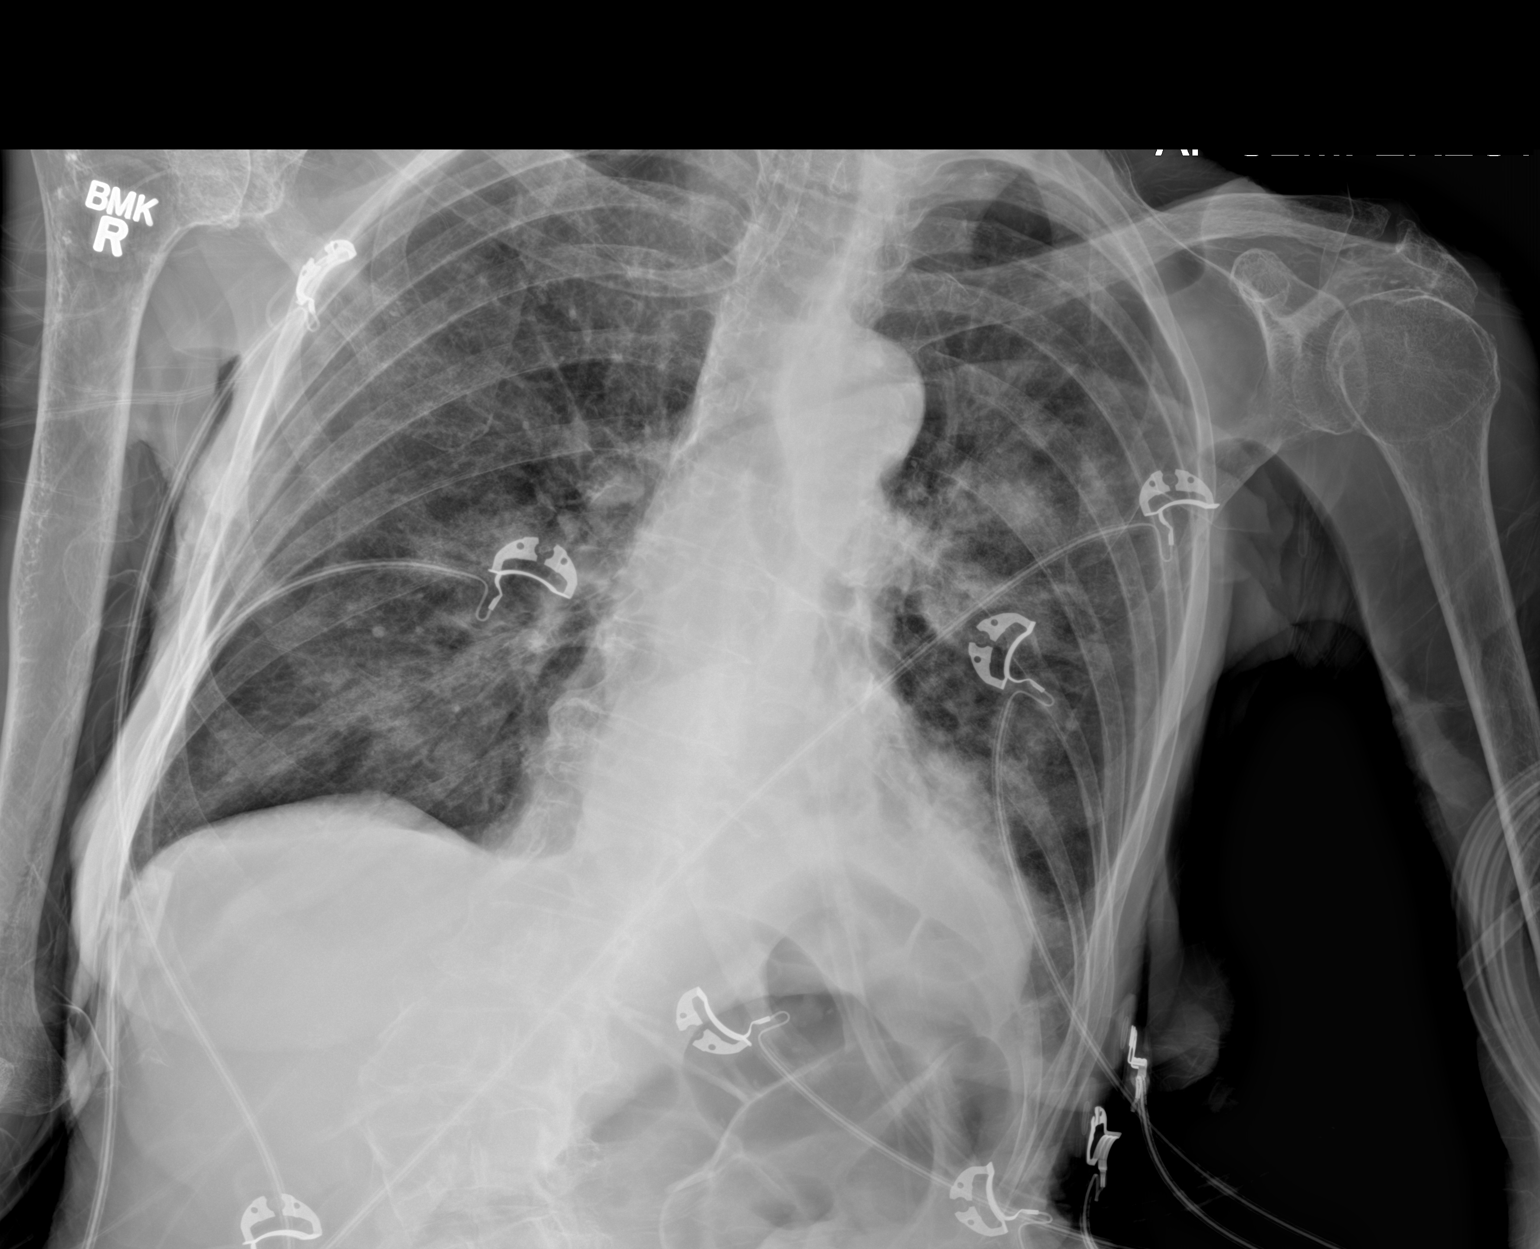

[1 of 1 positions shown; findings below may reference images not displayed]

FINDINGS: Patchy perihilar opacity bilaterally. More confluent consolidation
at the left base. Possible small left effusion. Normal heart size.
No pneumothorax. Vague lucency over the right upper quadrant.
IMPRESSION: 1. Patchy perihilar airspace opacity with more confluent
consolidation in the left lung base concerning for multifocal
pneumonia. Possible small left effusion
2. Vague lucency over the right upper quadrant of the abdomen,
recommend decubitus view to exclude free air.
